# Patient Record
Sex: Male | Born: 1985 | Race: White | Hispanic: No | Marital: Married | Smoking: Never smoker
Health system: Southern US, Community
[De-identification: ages and names within clinical notes are randomized; demographics above are authoritative.]

## PROBLEM LIST (undated history)

## (undated) DIAGNOSIS — A0472 Enterocolitis due to Clostridium difficile, not specified as recurrent: Secondary | ICD-10-CM

## (undated) DIAGNOSIS — R1310 Dysphagia, unspecified: Secondary | ICD-10-CM

## (undated) DIAGNOSIS — E559 Vitamin D deficiency, unspecified: Secondary | ICD-10-CM

## (undated) DIAGNOSIS — R7989 Other specified abnormal findings of blood chemistry: Secondary | ICD-10-CM

## (undated) DIAGNOSIS — A498 Other bacterial infections of unspecified site: Secondary | ICD-10-CM

## (undated) HISTORY — DX: Enterocolitis due to Clostridium difficile, not specified as recurrent: A04.72

## (undated) HISTORY — DX: Dysphagia, unspecified: R13.10

## (undated) HISTORY — PX: OTHER SURGICAL HISTORY: SHX169

## (undated) HISTORY — DX: Other specified abnormal findings of blood chemistry: R79.89

---

## 2017-10-31 ENCOUNTER — Encounter (INDEPENDENT_AMBULATORY_CARE_PROVIDER_SITE_OTHER): Payer: Self-pay

## 2017-10-31 ENCOUNTER — Ambulatory Visit (INDEPENDENT_AMBULATORY_CARE_PROVIDER_SITE_OTHER): Payer: 59 | Admitting: Gastroenterology

## 2017-10-31 ENCOUNTER — Other Ambulatory Visit: Payer: Self-pay

## 2017-10-31 ENCOUNTER — Encounter: Payer: Self-pay | Admitting: Gastroenterology

## 2017-10-31 VITALS — BP 119/73 | HR 73 | Temp 98.6°F | Ht 72.0 in | Wt 194.0 lb

## 2017-10-31 DIAGNOSIS — R1084 Generalized abdominal pain: Secondary | ICD-10-CM

## 2017-10-31 DIAGNOSIS — R197 Diarrhea, unspecified: Secondary | ICD-10-CM

## 2017-10-31 DIAGNOSIS — R634 Abnormal weight loss: Secondary | ICD-10-CM

## 2017-10-31 MED ORDER — TRAMADOL HCL 50 MG PO TABS: 50.0000 mg | ORAL_TABLET | Freq: Four times a day (QID) | ORAL | 0 refills | Status: DC | PRN

## 2017-10-31 MED ORDER — DICYCLOMINE HCL 10 MG PO CAPS: 10.0000 mg | ORAL_CAPSULE | Freq: Three times a day (TID) | ORAL | 0 refills | Status: DC | PRN

## 2017-10-31 NOTE — Patient Instructions (Signed)
Today's care has been provided by Dr. Sherri Sear.  During this visit she has discussed the following for your plan of care:  1. Labs to be completed at [facility] 2. CT Enterography to be scheduled.  Nurse will contact you with appt. 3. Call office to schedule colonoscopy in about 2 weeks. 4. Follow up with Dr. Marius Ditch in 3 weeks.   Thank you for allowing to take part of caring for your healthcare needs.

## 2017-10-31 NOTE — Progress Notes (Signed)
Cephas Darby, MD [address]  [place]  [place], [place] [address]  Main: [phone]  Fax: [phone]    Gastroenterology Consultation  Referring Provider:     Suzan Garibaldi, FNP Primary Care Physician:  Suzan Garibaldi, FNP Primary Gastroenterologist:  Dr. Cephas Darby Reason for Consultation:     Chronic bloody diarrhea        HPI:   Carlos Silva is a 32 y.o. Caucasian male referred by Dr. Suzan Garibaldi, [facility]  for consultation & management of chronic bloody diarrhea. He has been experiencing diarrhea since October, 2018. His symptoms started initially with sore throat, abdominal pain, diarrhea, diagnosed as strep throat for which he received penicillin derived antibiotic. Subsequently he developed severe bloody diarrhea, 10-12 times per day associated with urgency, underwent stool studies including C. difficile which came back negative. He received 10 days course of metronidazole when he went to urgent care due to worsening of diarrhea. Patient reports that the initial stool studies were performed after he received metronidazole. His diarrhea subsided with antibiotics. Subsequently, he had cut on his finger at work for which he received tetanus and diphtheria vaccine. He reports that diarrhea returned after he received vaccine, he had blood in stool, lower abdominal discomfort, weight loss, rash on his body, predominantly hives associated with itching, as well as red spots on his palms and around joints, he shared pictures of his rash on his phone, severe arthralgias of small and large joints associated with weakness, limping. He took Diflucan for rash, thought it might be fungal. He lost about 30 pounds during this entire course. He limited po intake secondary to severe diarrhea. He reports having 4-5 bowel movements per day, predominantly nonbloody, sometimes sees streaks of blood. He had stool studies repeated twice including C. difficile due to ongoing diarrhea and came back  negative. Norovirus was negative. TSH was normal. He most recently had CBC, CMP which were unremarkable. H. pylori stool antigen came back negative as well. He has good appetite, but unable to eat due to diarrhea. He modified his diet, avoiding milk, added probiotics. He denies smoking or alcohol. He is taking Zyrtec whenever he develops hives  He is accompanied by his wife today who is concerned about parasitic infection given his family history. His mother who was initially thought to have ulcerative colitis and underwent colectomy, eventually found to have strongyloidiasis infection. His mother's sister had history of Entamoeba histolytica. Patient's wife is concerned that the parasites are not detected in stool and requesting for blood tests to evaluate for strongyloidiasis. 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  NSAIDs: None  Antiplts/Anticoagulants/Anti thrombotics: None  GI Procedures: None No prior GI surgeries No known family history of GI malignancy No recent travel Unknown HIV status  No past medical history on file.     Current Outpatient Medications:  .  diclofenac (VOLTAREN) 50 MG EC tablet, Take 50 mg by mouth 2 (two) times daily., Disp: , Rfl: 0 .  dicyclomine (BENTYL) 10 MG capsule, Take 1 capsule (10 mg total) by mouth 3 (three) times daily with meals as needed for up to 30 doses for spasms., Disp: 30 capsule, Rfl: 0 .  fluconazole (DIFLUCAN) 150 MG tablet, TAKE 1 TABLET BY MOUTH EVERY DAY FOR 5 DAYS, Disp: , Rfl: 0 .  traMADol (ULTRAM) 50 MG tablet, Take 1 tablet (50 mg total) by mouth every 6 (six) hours as needed  for up to 15 doses., Disp: 30 tablet, Rfl: 0   No family history on file.   Social History   Tobacco Use  . Smoking status: Never Smoker  . Smokeless tobacco: Never Used  Substance Use Topics  . Alcohol use: No    Frequency: Never  . Drug use: No    Allergies as of 10/31/2017  .  (No Known Allergies)    Review of Systems:    All systems reviewed and negative except where noted in HPI.   Physical Exam:  BP 119/73   Pulse 73   Temp 98.6 F (37 C) (Oral)   Ht 6' (1.829 m)   Wt 194 lb (88 kg)   BMI 26.31 kg/m  No LMP for male patient.  General:   Alert,  Well-developed, well-nourished, pleasant and cooperative in NAD Head:  Normocephalic and atraumatic. Eyes:  Sclera clear, no icterus.   Conjunctiva pink. Ears:  Normal auditory acuity. Nose:  No deformity, discharge, or lesions. Mouth:  No deformity or lesions,oropharynx pink & moist. Neck:  Supple; no masses or thyromegaly. Lungs:  Respirations even and unlabored.  Clear throughout to auscultation.   No wheezes, crackles, or rhonchi. No acute distress. Heart:  Regular rate and rhythm; no murmurs, clicks, rubs, or gallops. Abdomen:  Normal bowel sounds. Soft, non-tender and non-distended without masses, hepatosplenomegaly or hernias noted.  No guarding or rebound tenderness.   Rectal: Not performed Msk:  Symmetrical without gross deformities. Good, equal movement & strength bilaterally. Pulses:  Normal pulses noted. Extremities:  No clubbing or edema.  No cyanosis. Neurologic:  Alert and oriented x3;  grossly normal neurologically. Skin:  Intact without significant lesions or rashes. No jaundice. Lymph Nodes:  No significant cervical adenopathy. Psych:  Alert and cooperative. Normal mood and affect.  Imaging Studies: No abdominal imaging  Assessment and Plan:   [name] is a 32 y.o. Caucasian  male with chronic diarrhea and weight loss associated with rash, arthritis. His symptoms started after strep throat infection and was treated with antibiotics. His diarrhea temporarily responded to short course of metronidazole. Differentials include inflammatory bowel disease or systemic mastocytosis or chronic infections or vasculitis   Given that stool studies have been negative for infections x 3 so far, I  highly recommend a colonoscopy. However, patient's wife expressed concern about perforation and recommended noninvasive modalities like CT scan  - Check CRP, ESR, fecal calprotectin - Check HIV - Check celiac serologies  - Recheck stool studies  - Strongyloidiasis antibodies  - ANA, rheumatoid factor, immunoglobulin panel - CT enterography  - Colonoscopy after the above workup  - In the interim, suggested patient to try dicyclomine as needed  - Suggested him to stop probiotics, avoid NSAIDs - He requested pain medication for severe arthralgias that are restricting him from performing his physical activities. I prescribed him tramadol 50 mg, 30pills with no refills    Follow up in 2-3 weeks   Cephas Darby, MD

## 2017-11-03 ENCOUNTER — Telehealth: Payer: Self-pay

## 2017-11-03 NOTE — Telephone Encounter (Signed)
Patient has been informed that his CRP, which is an inflammatory marker is extremely elevated and is concerning for inflammation in his gastrointestinal tract. You're waiting on his stool studies as well. You have low threshold to perform colonoscopy if the CT enterography and/or stool studies does or does not reveal any information. I'm also waiting on strongyloidiasis antibodies.  Thanks [employer]

## 2017-11-03 NOTE — Telephone Encounter (Signed)
LVM for patient to inform him of his appt for CT Enterography at [facility] 11/07/17 arrival time 10:30am.  Nothing to eat or drink 4 hours prior to procedure.

## 2017-11-03 NOTE — Telephone Encounter (Signed)
Patient stated that his joint pain is getting worse. I informed him that I would ask whether we should refer to rheumatology? What would be the next step please advise.  Thanks [employer]

## 2017-11-04 LAB — CBC
HEMATOCRIT: 42.8 % (ref 37.5–51.0)
HEMOGLOBIN: 14.4 g/dL (ref 13.0–17.7)
MCH: 29.4 pg (ref 26.6–33.0)
MCHC: 33.6 g/dL (ref 31.5–35.7)
MCV: 87 fL (ref 79–97)
Platelets: 325 10*3/uL (ref 150–379)
RBC: 4.9 x10E6/uL (ref 4.14–5.80)
RDW: 13.1 % (ref 12.3–15.4)
WBC: 9.2 10*3/uL (ref 3.4–10.8)

## 2017-11-04 LAB — VITAMIN B12: VITAMIN B 12: 475 pg/mL (ref 232–1245)

## 2017-11-04 LAB — CELIAC AB TTG DGP TIGA
Antigliadin Abs, IgA: 3 units (ref 0–19)
Gliadin IgG: 3 units (ref 0–19)
IgA/Immunoglobulin A, Serum: 121 mg/dL (ref 90–386)

## 2017-11-04 LAB — IGG, IGA, IGM
IGG (IMMUNOGLOBIN G), SERUM: 828 mg/dL (ref 700–1600)
IGM (IMMUNOGLOBULIN M), SRM: 84 mg/dL (ref 20–172)

## 2017-11-04 LAB — COMPREHENSIVE METABOLIC PANEL
ALBUMIN: 4.4 g/dL (ref 3.5–5.5)
ALT: 10 IU/L (ref 0–44)
AST: 14 IU/L (ref 0–40)
Albumin/Globulin Ratio: 1.8 (ref 1.2–2.2)
Alkaline Phosphatase: 58 IU/L (ref 39–117)
BUN / CREAT RATIO: 14 (ref 9–20)
BUN: 11 mg/dL (ref 6–20)
Bilirubin Total: 0.3 mg/dL (ref 0.0–1.2)
CO2: 23 mmol/L (ref 20–29)
CREATININE: 0.79 mg/dL (ref 0.76–1.27)
Calcium: 9 mg/dL (ref 8.7–10.2)
Chloride: 101 mmol/L (ref 96–106)
GFR, EST AFRICAN AMERICAN: 138 mL/min/{1.73_m2} (ref >59)
GFR, EST NON AFRICAN AMERICAN: 120 mL/min/{1.73_m2} (ref >59)
GLOBULIN, TOTAL: 2.5 g/dL (ref 1.5–4.5)
Glucose: 169 mg/dL — ABNORMAL HIGH (ref 65–99)
Potassium: 4.1 mmol/L (ref 3.5–5.2)
Sodium: 142 mmol/L (ref 134–144)
Total Protein: 6.9 g/dL (ref 6.0–8.5)

## 2017-11-04 LAB — SEDIMENTATION RATE: Sed Rate: 11 mm/hr (ref 0–15)

## 2017-11-04 LAB — ANA: Anti Nuclear Antibody(ANA): NEGATIVE

## 2017-11-04 LAB — ANTI-DNA ANTIBODY, DOUBLE-STRANDED: dsDNA Ab: 1 IU/mL (ref 0–9)

## 2017-11-04 LAB — TSH: TSH: 0.58 u[IU]/mL (ref 0.450–4.500)

## 2017-11-04 LAB — RHEUMATOID FACTOR: Rhuematoid fact SerPl-aCnc: <10 IU/mL (ref 0.0–13.9)

## 2017-11-04 LAB — C-REACTIVE PROTEIN: CRP: 15.8 mg/L — ABNORMAL HIGH (ref 0.0–4.9)

## 2017-11-04 LAB — FERRITIN: FERRITIN: 153 ng/mL (ref 30–400)

## 2017-11-05 ENCOUNTER — Telehealth: Payer: Self-pay | Admitting: Gastroenterology

## 2017-11-05 ENCOUNTER — Other Ambulatory Visit: Payer: Self-pay

## 2017-11-05 DIAGNOSIS — A498 Other bacterial infections of unspecified site: Secondary | ICD-10-CM

## 2017-11-05 LAB — C DIFFICILE TOXINS A+B W/RFLX: C DIFFICILE TOXINS A+B, EIA: POSITIVE — AB

## 2017-11-05 MED ORDER — FIDAXOMICIN 200 MG PO TABS: 200.0000 mg | ORAL_TABLET | Freq: Two times a day (BID) | ORAL | 0 refills | Status: AC

## 2017-11-05 MED ORDER — VANCOMYCIN HCL 125 MG PO CAPS: 125.0000 mg | ORAL_CAPSULE | Freq: Four times a day (QID) | ORAL | 0 refills | Status: DC

## 2017-11-05 MED ORDER — FIDAXOMICIN 200 MG PO TABS: 200.0000 mg | ORAL_TABLET | Freq: Two times a day (BID) | ORAL | 0 refills | Status: DC

## 2017-11-05 NOTE — Telephone Encounter (Signed)
Pt would like a call from nurse regarding rx he would like rx for becktamision the other rx ranges from 400-500  Dollars, he also was told he could get a note for work and has not heard anything yet please call pt

## 2017-11-05 NOTE — Telephone Encounter (Signed)
[facility] calling to let us know pt was approved and they will notify pt

## 2017-11-07 ENCOUNTER — Ambulatory Visit: Payer: Managed Care, Other (non HMO)

## 2017-11-10 ENCOUNTER — Other Ambulatory Visit: Payer: Self-pay

## 2017-11-10 ENCOUNTER — Telehealth: Payer: Self-pay | Admitting: Gastroenterology

## 2017-11-10 NOTE — Telephone Encounter (Signed)
[name] called stating he didn't get his medication until Friday 11/07/17 due to mail order. 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 Please call patient.

## 2017-11-12 ENCOUNTER — Telehealth: Payer: Self-pay | Admitting: Gastroenterology

## 2017-11-12 NOTE — Telephone Encounter (Signed)
PT CALLED BACK HE JUST SPOKE WITH DR. VANGA AND HE WAS NOT SURE IF HE SHOULD TAKE THE RX DIFISIT OR NOT FOR 2 WEEKS? PLEASE CALL BACK

## 2017-11-13 ENCOUNTER — Other Ambulatory Visit: Payer: Self-pay

## 2017-11-13 ENCOUNTER — Telehealth: Payer: Self-pay | Admitting: Gastroenterology

## 2017-11-13 DIAGNOSIS — R197 Diarrhea, unspecified: Secondary | ICD-10-CM

## 2017-11-13 DIAGNOSIS — R109 Unspecified abdominal pain: Secondary | ICD-10-CM

## 2017-11-13 DIAGNOSIS — R634 Abnormal weight loss: Secondary | ICD-10-CM

## 2017-11-13 NOTE — Telephone Encounter (Signed)
Pt is calling to see if Dr. Marius Ditch put in the new Labs for him, please call pt once lab orders are in so he can go get them drawn

## 2017-11-13 NOTE — Progress Notes (Signed)
Hello Shanon Brow,  Dr. Marius Ditch has informed me that she would like for you to have the following labs checked:  Strongyloides and Fecal Calprotectin.  I've ordered these labs for [facility], however if they need to go to a different lab-please let me know so I can send the orders.  Thanks [place] CMA

## 2017-11-14 ENCOUNTER — Other Ambulatory Visit: Payer: Self-pay | Admitting: Gastroenterology

## 2017-11-14 ENCOUNTER — Other Ambulatory Visit: Payer: Self-pay

## 2017-11-14 DIAGNOSIS — R197 Diarrhea, unspecified: Secondary | ICD-10-CM

## 2017-11-14 DIAGNOSIS — R109 Unspecified abdominal pain: Secondary | ICD-10-CM

## 2017-11-14 DIAGNOSIS — R634 Abnormal weight loss: Secondary | ICD-10-CM

## 2017-11-14 NOTE — Telephone Encounter (Signed)
Please call patient regarding labs

## 2017-11-17 ENCOUNTER — Encounter: Payer: Self-pay | Admitting: Gastroenterology

## 2017-11-17 ENCOUNTER — Ambulatory Visit (INDEPENDENT_AMBULATORY_CARE_PROVIDER_SITE_OTHER): Payer: Managed Care, Other (non HMO) | Admitting: Adult Health

## 2017-11-17 ENCOUNTER — Encounter: Payer: Self-pay | Admitting: Adult Health

## 2017-11-17 VITALS — BP 109/68 | HR 59 | Ht 72.01 in | Wt 193.0 lb

## 2017-11-17 DIAGNOSIS — R7989 Other specified abnormal findings of blood chemistry: Secondary | ICD-10-CM | POA: Diagnosis not present

## 2017-11-17 DIAGNOSIS — Z0000 Encounter for general adult medical examination without abnormal findings: Secondary | ICD-10-CM | POA: Diagnosis not present

## 2017-11-17 HISTORY — DX: Other specified abnormal findings of blood chemistry: R79.89

## 2017-11-17 LAB — STRONGYLOIDES, AB, IGG: STRONGYLOIDES, AB, IGG: NEGATIVE

## 2017-11-17 NOTE — Progress Notes (Signed)
Subjective:    Patient ID: Carlos Silva, male    DOB: [birth year], 32 y.o.   MRN: [id number]  HPI:  [name] presents to establish as a new pt.  He is a pleasant 32 year old male.  06/2017: He had sore throat, abdominal pain, diarrhea, diagnosed as strep throat for which he received penicillin derived ABX. Subsequently he developed severe bloody diarrhea, 10-12 times per day associated with urgency, underwent stool studies including C. difficile which came back negative. He received 10 days course of metronidazole when he went to urgent care due to worsening of diarrhea and abdominal pain. 10/06/17: R 2nd finger laceration sustained at work for which he received tdap. He reports that diarrhea returned after he received vaccine, he had blood in stool, lower abdominal pain,  rash/hives on trunk associated with itching, as well as red spots on his palms and diffuse joint pain-predominantly in L hip. He lost >35 lbs since Oct 2018, largely due to limiting PO intake due to severe diarrhea.  10/31/17- C-reactive protein sig elevated- 15.8 (normal 0-4.9). ANA/Rheumatoid Factors- Neg 10/31/17- CMP/CBC - WNL 10/31/17- C diff : Pos  He modified his diet, avoiding dairy, added multivitamin and reports "feeling like myself the last 3 days". He denies smoking or alcohol. He is taking Zyrtec whenever he develops hives Family GI hx- his mother initially thought to have ulcerative colitis and underwent colectomy, eventually found to have strongyloidiasis infection.  Due to colectomy his mother is very wary of modern medical care and  His maternal Aunt had history of Entamoeba histolytica. They live in a farm with poultry. 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 He reports mild/acute depression r/t recent illness.  He declined to complete PHQ today b/c "it isn't an accurate look at how I really feel". His wife is a BS during [employer]   Patient Care Team    Relationship Specialty Notifications Start End  Erikka Follmer, Berna Spare, NP PCP - General Family Medicine  11/17/17   Lin Landsman, MD Consulting Physician Gastroenterology  11/17/17     Patient Active Problem List   Diagnosis Date Noted  . Healthcare maintenance 11/17/2017   No past medical history on file.  Family History  Problem Relation Age of Onset  . Congestive Heart Failure Mother   . Vision loss Maternal Grandfather   . Cancer Paternal Grandfather      Social History   Substance and Sexual Activity  Drug Use No     Social History   Substance and Sexual Activity  Alcohol Use No  . Frequency: Never     Social History   Tobacco Use  Smoking Status Never Smoker  Smokeless Tobacco Never Used     Outpatient Encounter Medications as of 11/17/2017  Medication Sig  . cetirizine (ZYRTEC) 10 MG tablet Take 10 mg by mouth daily.  . fidaxomicin (DIFICID) 200 MG TABS tablet Take by mouth 2 (two) times daily.  . Multiple Vitamin (MULTIVITAMIN) tablet Take by mouth daily.  . traMADol (ULTRAM) 50 MG tablet Take 1 tablet (50 mg total) by mouth every 6 (six) hours as needed for up to 15 doses.  . [DISCONTINUED] diclofenac (VOLTAREN) 50 MG EC tablet Take 50 mg by mouth 2 (two) times daily.  . [DISCONTINUED] dicyclomine (BENTYL) 10 MG capsule Take 1 capsule (10 mg total) by mouth 3 (three) times daily with meals as needed for up to 30 doses for spasms.  . [DISCONTINUED] fluconazole (DIFLUCAN) 150 MG tablet TAKE  1 TABLET BY MOUTH EVERY DAY FOR 5 DAYS   No facility-administered encounter medications on file as of 11/17/2017.     Allergies: Patient has no known allergies.  Body mass index is 26.17 kg/m.  Blood pressure 109/68, pulse (!) 59, height 6' 0.01" (1.829 m), weight 193 lb (87.5 kg), SpO2 98 %.   Review of Systems  Constitutional: Positive for activity change, appetite change, fatigue and unexpected weight change. Negative for chills, diaphoresis and fever.  Eyes: Negative for visual disturbance.  Respiratory: Negative for  cough, chest tightness, shortness of breath, wheezing and stridor.   Cardiovascular: Negative for chest pain, palpitations and leg swelling.  Gastrointestinal: Negative for abdominal distention, abdominal pain, blood in stool, constipation, diarrhea, nausea and vomiting.  Endocrine: Negative for cold intolerance.  Genitourinary: Negative for difficulty urinating and flank pain.  Musculoskeletal: Positive for arthralgias and myalgias.  Neurological: Negative for dizziness and headaches.  Hematological: Does not bruise/bleed easily.  Psychiatric/Behavioral: Positive for dysphoric mood. The patient is nervous/anxious.        Objective:   Physical Exam  Constitutional: He is oriented to person, place, and time. He appears well-developed and well-nourished. No distress.  HENT:  Head: Normocephalic and atraumatic.  Right Ear: External ear normal.  Left Ear: External ear normal.  Cardiovascular: Normal rate, regular rhythm, normal heart sounds and intact distal pulses.  No murmur heard. Pulmonary/Chest: Effort normal and breath sounds normal.  Abdominal: Soft. Bowel sounds are normal. He exhibits no distension and no mass. There is no tenderness. There is no rebound and no guarding.  + BS in all quadrants  Neurological: He is alert and oriented to person, place, and time.  Skin: Skin is warm and dry. No rash noted. He is not diaphoretic. No erythema. No pallor.  Psychiatric: He has a normal mood and affect. His behavior is normal. Judgment and thought content normal.  Nursing note and vitals reviewed.      Assessment & Plan:   1. Healthcare maintenance   2. Abnormal C-reactive protein     Healthcare maintenance  Complete course of Fidaxomicin (Dificid) as directed by GI. Continue with GI as directed. Once you confirm which Rheumatologist is in your network, please call clinic so we can place referral. Continue healthy eating and daily multivitamin. Recommend complete physical with  fasting labs (as needed) in 3 months.   Abnormal C-reactive protein  10/31/17- C-reactive protein sig elevated- 15.8 (normal 0-4.9). ANA/Rheumatoid Factors- Neg With diffuse joint pain/rash over body, recommend referral to Rheumatologist        Pt was in the office today for 40+ minutes, with over 50% time spent in face to face           counseling of various medical concerns and in coordination of care  FOLLOW-UP:  Return in about 3 months (around 02/17/2018) for Regular Follow Up, CPE.

## 2017-11-17 NOTE — Assessment & Plan Note (Signed)
10/31/17- C-reactive protein sig elevated- 15.8 (normal 0-4.9). ANA/Rheumatoid Factors- Neg With diffuse joint pain/rash over body, recommend referral to Rheumatologist

## 2017-11-17 NOTE — Patient Instructions (Signed)
Heart-Healthy Eating Plan Heart-healthy meal planning includes:  Limiting unhealthy fats.  Increasing healthy fats.  Making other small dietary changes.  You may need to talk with your doctor or a diet specialist (dietitian) to create an eating plan that is right for you. What types of fat should I choose?  Choose healthy fats. These include olive oil and canola oil, flaxseeds, walnuts, almonds, and seeds.  Eat more omega-3 fats. These include salmon, mackerel, sardines, tuna, flaxseed oil, and ground flaxseeds. Try to eat fish at least twice each week.  Limit saturated fats. ? Saturated fats are often found in animal products, such as meats, butter, and cream. ? Plant sources of saturated fats include palm oil, palm kernel oil, and coconut oil.  Avoid foods with partially hydrogenated oils in them. These include stick margarine, some tub margarines, cookies, crackers, and other baked goods. These contain trans fats. What general guidelines do I need to follow?  Check food labels carefully. Identify foods with trans fats or high amounts of saturated fat.  Fill one half of your plate with vegetables and green salads. Eat 4-5 servings of vegetables per day. A serving of vegetables is: ? 1 cup of raw leafy vegetables. ?  cup of raw or cooked cut-up vegetables. ?  cup of vegetable juice.  Fill one fourth of your plate with whole grains. Look for the word "whole" as the first word in the ingredient list.  Fill one fourth of your plate with lean protein foods.  Eat 4-5 servings of fruit per day. A serving of fruit is: ? One medium whole fruit. ?  cup of dried fruit. ?  cup of fresh, frozen, or canned fruit. ?  cup of 100% fruit juice.  Eat more foods that contain soluble fiber. These include apples, broccoli, carrots, beans, peas, and barley. Try to get 20-30 g of fiber per day.  Eat more home-cooked food. Eat less restaurant, buffet, and fast food.  Limit or avoid  alcohol.  Limit foods high in starch and sugar.  Avoid fried foods.  Avoid frying your food. Try baking, boiling, grilling, or broiling it instead. You can also reduce fat by: ? Removing the skin from poultry. ? Removing all visible fats from meats. ? Skimming the fat off of stews, soups, and gravies before serving them. ? Steaming vegetables in water or broth.  Lose weight if you are overweight.  Eat 4-5 servings of nuts, legumes, and seeds per week: ? One serving of dried beans or legumes equals  cup after being cooked. ? One serving of nuts equals 1 ounces. ? One serving of seeds equals  ounce or one tablespoon.  You may need to keep track of how much salt or sodium you eat. This is especially true if you have high blood pressure. Talk with your doctor or dietitian to get more information. What foods can I eat? Grains Breads, including French, white, pita, wheat, raisin, rye, oatmeal, and Italian. Tortillas that are neither fried nor made with lard or trans fat. Low-fat rolls, including hotdog and hamburger buns and English muffins. Biscuits. Muffins. Waffles. Pancakes. Light popcorn. Whole-grain cereals. Flatbread. Melba toast. Pretzels. Breadsticks. Rusks. Low-fat snacks. Low-fat crackers, including oyster, saltine, matzo, graham, animal, and rye. Rice and pasta, including brown rice and pastas that are made with whole wheat. Vegetables All vegetables. Fruits All fruits, but limit coconut. Meats and Other Protein Sources Lean, well-trimmed beef, veal, pork, and lamb. Chicken and turkey without skin. All fish and shellfish.   Wild duck, rabbit, pheasant, and venison. Egg whites or low-cholesterol egg substitutes. Dried beans, peas, lentils, and tofu. Seeds and most nuts. Dairy Low-fat or nonfat cheeses, including ricotta, string, and mozzarella. Skim or 1% milk that is liquid, powdered, or evaporated. Buttermilk that is made with low-fat milk. Nonfat or low-fat  yogurt. Beverages Mineral water. Diet carbonated beverages. Sweets and Desserts Sherbets and fruit ices. Honey, jam, marmalade, jelly, and syrups. Meringues and gelatins. Pure sugar candy, such as hard candy, jelly beans, gumdrops, mints, marshmallows, and small amounts of dark chocolate. [employer] Eat all sweets and desserts in moderation. Fats and Oils Nonhydrogenated (trans-free) margarines. Vegetable oils, including soybean, sesame, sunflower, olive, peanut, safflower, corn, canola, and cottonseed. Salad dressings or mayonnaise made with a vegetable oil. Limit added fats and oils that you use for cooking, baking, salads, and as spreads. Other Cocoa powder. Coffee and tea. All seasonings and condiments. The items listed above may not be a complete list of recommended foods or beverages. Contact your dietitian for more options. What foods are not recommended? Grains Breads that are made with saturated or trans fats, oils, or whole milk. Croissants. Butter rolls. Cheese breads. Sweet rolls. Donuts. Buttered popcorn. Chow mein noodles. High-fat crackers, such as cheese or butter crackers. Meats and Other Protein Sources Fatty meats, such as hotdogs, short ribs, sausage, spareribs, bacon, rib eye roast or steak, and mutton. High-fat deli meats, such as salami and bologna. Caviar. Domestic duck and goose. Organ meats, such as kidney, liver, sweetbreads, and heart. Dairy Cream, sour cream, cream cheese, and creamed cottage cheese. Whole-milk cheeses, including blue (bleu), [place], [place], [place], American, [place], Swiss, cheddar, [place], and [place]. Whole or 2% milk that is liquid, evaporated, or condensed. Whole buttermilk. Cream sauce or high-fat cheese sauce. Yogurt that is made from whole milk. Beverages Regular sodas and juice drinks with added sugar. Sweets and Desserts Frosting. Pudding. Cookies. Cakes other than angel food cake. Candy that has milk chocolate or white  chocolate, hydrogenated fat, butter, coconut, or unknown ingredients. Buttered syrups. Full-fat ice cream or ice cream drinks. Fats and Oils Gravy that has suet, meat fat, or shortening. Cocoa butter, hydrogenated oils, palm oil, coconut oil, palm kernel oil. These can often be found in baked products, candy, fried foods, nondairy creamers, and whipped toppings. Solid fats and shortenings, including bacon fat, salt pork, lard, and butter. Nondairy cream substitutes, such as coffee creamers and sour cream substitutes. Salad dressings that are made of unknown oils, cheese, or sour cream. The items listed above may not be a complete list of foods and beverages to avoid. Contact your dietitian for more information. This information is not intended to replace advice given to you by your health care provider. Make sure you discuss any questions you have with your health care provider. Document Released: 02/18/2012 Document Revised: 01/25/2016 Document Reviewed: 02/10/2014 Elsevier Interactive Patient Education  2018 [employer].   Complete course of Fidaxomicin (Dificid) as directed by GI. Continue with GI as directed. Once you confirm which Rheumatologist is in your network, please call clinic so we can place referral. Continue healthy eating and daily multivitamin. Recommend complete physical with fasting labs (as needed) in 3 months.  WELCOME TO THE PRACTICE!

## 2017-11-17 NOTE — Assessment & Plan Note (Signed)
  Complete course of Fidaxomicin (Dificid) as directed by GI. Continue with GI as directed. Once you confirm which Rheumatologist is in your network, please call clinic so we can place referral. Continue healthy eating and daily multivitamin. Recommend complete physical with fasting labs (as needed) in 3 months.

## 2017-11-18 ENCOUNTER — Telehealth: Payer: Self-pay | Admitting: Adult Health

## 2017-11-18 DIAGNOSIS — M25551 Pain in right hip: Secondary | ICD-10-CM

## 2017-11-18 DIAGNOSIS — R7989 Other specified abnormal findings of blood chemistry: Secondary | ICD-10-CM

## 2017-11-18 DIAGNOSIS — M25552 Pain in left hip: Secondary | ICD-10-CM

## 2017-11-18 NOTE — Telephone Encounter (Signed)
Pt was told to chk with [employer] regarding an Network provider for Rheumatologist:  Dr Lahoma Rocker with [place] per patient's wife is who was advised.  ----- pls send referral for patient.  --glh

## 2017-11-18 NOTE — Telephone Encounter (Signed)
Referral placed per Mina Marble, NP.  Charyl Bigger,  CMA

## 2017-11-19 ENCOUNTER — Encounter: Payer: Self-pay | Admitting: Gastroenterology

## 2017-11-20 LAB — CALPROTECTIN, FECAL

## 2017-12-01 ENCOUNTER — Encounter: Payer: Self-pay | Admitting: Gastroenterology

## 2017-12-01 ENCOUNTER — Ambulatory Visit (INDEPENDENT_AMBULATORY_CARE_PROVIDER_SITE_OTHER): Payer: Managed Care, Other (non HMO) | Admitting: Gastroenterology

## 2017-12-01 VITALS — BP 128/78 | HR 72 | Ht 72.0 in | Wt 195.2 lb

## 2017-12-01 DIAGNOSIS — Z8619 Personal history of other infectious and parasitic diseases: Secondary | ICD-10-CM | POA: Diagnosis not present

## 2017-12-01 DIAGNOSIS — R197 Diarrhea, unspecified: Secondary | ICD-10-CM

## 2017-12-01 NOTE — Patient Instructions (Signed)
1. Florastar, culturelle, VSL #3, Align are different probiotics   Please call our office to speak with my nurse Orvilla Fus at [phone] during business hours from 8am to 4pm if you have any questions/concerns. During after hours, you will be redirected to on call GI physician. For any emergency please call 911 or go the nearest emergency room.    Cephas Darby, MD [address]  [place]  [place], [place] [address]  Main: [phone]  Fax: [phone]

## 2017-12-01 NOTE — Progress Notes (Signed)
Cephas Darby, MD [address]  [place]  [place], [place] [address]  Main: [phone]  Fax: [phone]    Gastroenterology Consultation  Referring Provider:     Suzan Garibaldi, FNP Primary Care Physician:  Esaw Grandchild, NP Primary Gastroenterologist:  Dr. Cephas Darby Reason for Consultation:     Chronic bloody diarrhea        HPI:   Carlos Silva is a 32 y.o. Caucasian male referred by Dr. Loleta Books, Berna Spare, NP  for consultation & management of chronic bloody diarrhea. He has been experiencing diarrhea since October, 2018. His symptoms started initially with sore throat, abdominal pain, diarrhea, diagnosed as strep throat for which he received penicillin derived antibiotic. Subsequently he developed severe bloody diarrhea, 10-12 times per day associated with urgency, underwent stool studies including C. difficile which came back negative. He received 10 days course of metronidazole when he went to urgent care due to worsening of diarrhea. Patient reports that the initial stool studies were performed after he received metronidazole. His diarrhea subsided with antibiotics. Subsequently, he had cut on his finger at work for which he received tetanus and diphtheria vaccine. He reports that diarrhea returned after he received vaccine, he had blood in stool, lower abdominal discomfort, weight loss, rash on his body, predominantly hives associated with itching, as well as red spots on his palms and around joints, he shared pictures of his rash on his phone, severe arthralgias of small and large joints associated with weakness, limping. He took Diflucan for rash, thought it might be fungal. He lost about 30 pounds during this entire course. He limited po intake secondary to severe diarrhea. He reports having 4-5 bowel movements per day, predominantly nonbloody, sometimes sees streaks of blood. He had stool studies repeated twice including C. difficile due to ongoing diarrhea and came back  negative. Norovirus was negative. TSH was normal. He most recently had CBC, CMP which were unremarkable. H. pylori stool antigen came back negative as well. He has good appetite, but unable to eat due to diarrhea. He modified his diet, avoiding milk, added probiotics. He denies smoking or alcohol. He is taking Zyrtec whenever he develops hives  He is accompanied by his wife today who is concerned about parasitic infection given his family history. His mother who was initially thought to have ulcerative colitis and underwent colectomy, eventually found to have strongyloidiasis infection. His mother's sister had history of Entamoeba histolytica. Patient's wife is concerned that the parasites are not detected in stool and requesting for blood tests to evaluate for strongyloidiasis. 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  Follow-up visit 12/01/2017 Since last visit, patient had stool studies, resulted positive for C. Difficile toxin, completed 10 days course of dificid. His diarrhea has significantly resolved and overall his weight has been stable. His appetite has improved and able to tolerate some foods. However, he has several questions today along with his wife and concerned about ongoing rash and intolerance to several foods such as processed foods, lactose products. He is still concerned about developing rash after eating certain foods and has to take antihistamine medications. He no longer has blood in stools, his bowels are mostly soft in consistency, up to 3-4 times daily. 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 He does have mild abdominal discomfort especially before  having a bowel movement. He had repeat CRP at rheumatologist's office and came back normal. Strongyloides antibodies came back negative, fecal calprotectin came  back normal. Both patient and his wife are concerned about the residual symptoms and wanted to know if he is recovering from C. Difficile or if he has alternative pathology going on. He is resistant to undergo colonoscopy at this point and concerned about taking biopsies leading to complications. His wife wanted to know if he could be evaluated for systemic mastocytosis.  NSAIDs: None  Antiplts/Anticoagulants/Anti thrombotics: None  GI Procedures: None No prior GI surgeries No known family history of GI malignancy No recent travel Unknown HIV status  History reviewed. No pertinent past medical history.  Current Outpatient Medications:  .  cetirizine (ZYRTEC) 10 MG tablet, Take 10 mg by mouth daily., Disp: , Rfl:  .  fidaxomicin (DIFICID) 200 MG TABS tablet, Take by mouth 2 (two) times daily., Disp: , Rfl:  .  Multiple Vitamin (MULTIVITAMIN) tablet, Take by mouth daily., Disp: , Rfl:  .  traMADol (ULTRAM) 50 MG tablet, Take 1 tablet (50 mg total) by mouth every 6 (six) hours as needed for up to 15 doses., Disp: 30 tablet, Rfl: 0   Family History  Problem Relation Age of Onset  . Congestive Heart Failure Mother   . Vision loss Maternal Grandfather   . Cancer Paternal Grandfather      Social History   Tobacco Use  . Smoking status: Never Smoker  . Smokeless tobacco: Never Used  Substance Use Topics  . Alcohol use: No    Frequency: Never  . Drug use: No    Allergies as of 12/01/2017  . (No Known Allergies)    Review of Systems:    All systems reviewed and negative except where noted in HPI.   Physical Exam:  BP 128/78   Pulse 72   Ht 6' (1.829 m)   Wt 195 lb 3.2 oz (88.5 kg)   BMI 26.47 kg/m  No LMP for male patient.  General:   Alert,  Well-developed, well-nourished, pleasant and cooperative in NAD Head:  Normocephalic and atraumatic. Eyes:  Sclera clear, no icterus.   Conjunctiva pink. Ears:  Normal auditory acuity. Nose:  No deformity, discharge, or  lesions. Mouth:  No deformity or lesions,oropharynx pink & moist. Neck:  Supple; no masses or thyromegaly. Lungs:  Respirations even and unlabored.  Clear throughout to auscultation.   No wheezes, crackles, or rhonchi. No acute distress. Heart:  Regular rate and rhythm; no murmurs, clicks, rubs, or gallops. Abdomen:  Normal bowel sounds. Soft, non-tender and non-distended without masses, hepatosplenomegaly or hernias noted.  No guarding or rebound tenderness.   Rectal: Not performed Msk:  Symmetrical without gross deformities. Good, equal movement & strength bilaterally. Pulses:  Normal pulses noted. Extremities:  No clubbing or edema.  No cyanosis. Neurologic:  Alert and oriented x3;  grossly normal neurologically. Skin:  Intact without significant lesions or rashes. No jaundice. Lymph Nodes:  No significant cervical adenopathy. Psych:  Alert and cooperative. Normal mood and affect.  Imaging Studies: No abdominal imaging  Assessment and Plan:   Carlos Silva is a 32 y.o. Caucasian  male with chronic diarrhea and weight loss associated with rash, arthritis. His symptoms started after strep throat infection and was treated with antibiotics. His diarrhea temporarily responded to short course of metronidazole. Differentials include inflammatory bowel disease or systemic mastocytosis or chronic infections or vasculitis. Workup revealed C. Difficile infection and underwent treatment with dificid  x 10days. His diarrhea has currently resolved and weight has been stable. He continues to have arthralgias and rash, intolerance to several foods. His CRP has normalized it is reassuring. His residual GI symptoms are probably secondary to recent C. Difficile infection. Encouraged him to try probiotics, avoid lactose, processed foods. He is interested in undergoing further testing to look for alternative etiologies. However, he does not want to undergo colonoscopy at this point. He also appears to be worried  about flushing and rash, intolerance to several foods I recommended him to see dermatology or an immunologist at a tertiary care center  - check serum trypase levels - check C1 esterase inhibitor levels  I spent a total of 60 minutes of which more than 50% was spent in counseling and coordination of care regarding ongoing GI symptoms, recovery from C. Difficile infection and possibility of recurrence of C. Difficile if his symptoms flare up, discussing about alternative etiologies and further workup. I also discussed with him about the implications of colonoscopy, risks and benefits associated with it if his symptoms worsen. Patient and his wife expressed understanding of the plan and he will contact via my chart.   Follow up in clinic as needed if symptoms worsen   Cephas Darby, MD

## 2017-12-03 LAB — TRYPTASE: TRYPTASE: 12.9 ug/L (ref 2.2–13.2)

## 2017-12-03 LAB — C1 ESTERASE INHIBITOR: C1INH SerPl-mCnc: 28 mg/dL (ref 21–39)

## 2018-01-29 ENCOUNTER — Ambulatory Visit: Payer: Managed Care, Other (non HMO) | Admitting: Adult Health

## 2018-02-02 ENCOUNTER — Ambulatory Visit (INDEPENDENT_AMBULATORY_CARE_PROVIDER_SITE_OTHER): Payer: Managed Care, Other (non HMO) | Admitting: Adult Health

## 2018-02-02 ENCOUNTER — Encounter: Payer: Self-pay | Admitting: Adult Health

## 2018-02-02 VITALS — BP 110/70 | HR 68 | Ht 72.0 in | Wt 206.8 lb

## 2018-02-02 DIAGNOSIS — R1310 Dysphagia, unspecified: Secondary | ICD-10-CM

## 2018-02-02 DIAGNOSIS — L659 Nonscarring hair loss, unspecified: Secondary | ICD-10-CM | POA: Insufficient documentation

## 2018-02-02 DIAGNOSIS — A0472 Enterocolitis due to Clostridium difficile, not specified as recurrent: Secondary | ICD-10-CM | POA: Diagnosis not present

## 2018-02-02 DIAGNOSIS — F419 Anxiety disorder, unspecified: Secondary | ICD-10-CM | POA: Insufficient documentation

## 2018-02-02 DIAGNOSIS — Z0000 Encounter for general adult medical examination without abnormal findings: Secondary | ICD-10-CM

## 2018-02-02 HISTORY — DX: Dysphagia, unspecified: R13.10

## 2018-02-02 LAB — POCT RAPID STREP A (OFFICE): Rapid Strep A Screen: NEGATIVE

## 2018-02-02 NOTE — Patient Instructions (Signed)
Clostridium Difficile Infection Clostridium difficile (C. difficile or C. diff) infection causes inflammation of the large intestine (colon). This condition can result in damage to the lining of your colon and may lead to another condition called colitis. This infection can be passed from person to person (is contagious). Follow these instructions at home: Eating and drinking  Drink enough fluid to keep your pee (urine) clear or pale yellow.  Avoid drinking: ? Milk. ? Caffeine. ? Alcohol.  Follow exact instructions from your doctor about how to get enough fluid in your body (rehydrate).  Eat small meals often instead of large meals. Medicines  Take your antibiotic medicine as told by your doctor. Do not stop taking the antibiotic even if you start to feel better unless your doctor told you to do that.  Take over-the-counter and prescription medicines only as told by your doctor.  Do not use medicines to help with watery poop (diarrhea). General instructions  Wash your hands fully before you prepare food and after you use the bathroom. Make sure people who live with you also wash their  hands often.  Clean the surfaces that you touch. Use a product that contains chlorine bleach.  Keep all follow-up visits as told by your doctor. This is important. Contact a doctor if:  Your symptoms do not get better with treatment.  Your symptoms get worse with treatment.  Your symptoms go away and then come back.  You have a fever.  You have new symptoms. Get help right away if:  You have more pain or tenderness in your belly (abdomen).  Your poop (stool) is mostly bloody.  Your poop looks dark black and tarry.  You cannot eat or drink without throwing up (vomiting).  You have signs of dehydration, such as: ? Dark pee, very little pee, or no pee. ? Cracked lips. ? Not making tears when you cry. ? Dry mouth. ? Sunken eyes. ? Feeling sleepy. ? Feeling weak. ? Feeling  dizzy. This information is not intended to replace advice given to you by your health care provider. Make sure you discuss any questions you have with your health care provider. Document Released: 06/16/2009 Document Revised: 01/25/2016 Document Reviewed: 02/20/2015 Elsevier Interactive Patient Education  2017 [place].   Please have fasting labs drawn tomorrow. Ultrasound of neck ordered. We will call you when results are available. Please call clinic with questions/concerns. FEEL BETTER!

## 2018-02-02 NOTE — Assessment & Plan Note (Signed)
Strep Test- Neg US soft tissue/neck ordered Thyroid panel ordered

## 2018-02-02 NOTE — Assessment & Plan Note (Signed)
Pt's face is flushed and he appears quite anxious/nervous

## 2018-02-02 NOTE — Assessment & Plan Note (Signed)
Thyroid panel ordered Last TSH 10/31/17- 0.580

## 2018-02-02 NOTE — Progress Notes (Signed)
Subjective:    Patient ID: Carlos Silva, male    DOB: 03-Feb-1986, 32 y.o.   MRN: [id number]  HPI: 11/17/17 OV:  Carlos Silva presents to establish as a new pt.  He is a pleasant 32 year old male.  06/2017: He had sore throat, abdominal pain, diarrhea, diagnosed as strep throat for which he received penicillin derived ABX. Subsequently he developed severe bloody diarrhea, 10-12 times per day associated with urgency, underwent stool studies including C. difficile which came back negative. He received 10 days course of metronidazole when he went to urgent care due to worsening of diarrhea and abdominal pain. 10/06/17: R 2nd finger laceration sustained at work for which he received tdap. He reports that diarrhea returned after he received vaccine, he had blood in stool, lower abdominal pain,  rash/hives on trunk associated with itching, as well as red spots on his palms and diffuse joint pain-predominantly in L hip. He lost >35 lbs since Oct 2018, largely due to limiting PO intake due to severe diarrhea.  10/31/17- C-reactive protein sig elevated- 15.8 (normal 0-4.9). ANA/Rheumatoid Factors- Neg 10/31/17- CMP/CBC - WNL 10/31/17- C diff : Pos  He modified his diet, avoiding dairy, added multivitamin and reports "feeling like myself the last 3 days". He denies smoking or alcohol. He is taking Zyrtec whenever he develops hives Family GI hx- his mother initially thought to have ulcerative colitis and underwent colectomy, eventually found to have strongyloidiasis infection.  Due to colectomy his mother is very wary of modern medical care and  His maternal Aunt had history of Entamoeba histolytica. They live in a farm with poultry. 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 He reports mild/acute depression r/t recent illness.  He declined to complete PHQ today b/c "it isn't an accurate look at how I really feel". His wife is a BS during [employer]   02/02/18 OV: Carlos Silva presents with pain with swallowing or yawning,  described as "dull ache" and rates 2/10, that began >2 weeks ago.  He reports discomfort when pressing on his laryngeal prominence.  He denies throat pain at rest. He denies hx of GERD. He denies fever/night sweats/chills. He reports intermittent nausea without vomiting. He reports hx of seasonal allergies, however denies current nasal drainage, cough, post nasal gtt He also reports recent hair loss Wife at [employer] during [place]   Patient Care Team    Relationship Specialty Notifications Start End  Esaw Grandchild, NP PCP - General Family Medicine  11/17/17   Lin Landsman, MD Consulting Physician Gastroenterology  11/17/17     Patient Active Problem List   Diagnosis Date Noted  . Odynophagia 02/02/2018  . Hair loss 02/02/2018  . Anxiety 02/02/2018  . C. difficile enteritis 02/02/2018  . Healthcare maintenance 11/17/2017  . Abnormal C-reactive protein 11/17/2017   Past Medical History:  Diagnosis Date  . C. difficile enteritis     Family History  Problem Relation Age of Onset  . Congestive Heart Failure Mother   . Vision loss Maternal Grandfather   . Cancer Paternal Grandfather      Social History   Substance and Sexual Activity  Drug Use No     Social History   Substance and Sexual Activity  Alcohol Use No  . Frequency: Never     Social History   Tobacco Use  Smoking Status Never Smoker  Smokeless Tobacco Never Used     Outpatient Encounter Medications as of 02/02/2018  Medication Sig  . cetirizine (ZYRTEC)  10 MG tablet Take 10 mg by mouth daily.  . fidaxomicin (DIFICID) 200 MG TABS tablet Take by mouth 2 (two) times daily.  . Multiple Vitamin (MULTIVITAMIN) tablet Take by mouth daily.  . traMADol (ULTRAM) 50 MG tablet Take 1 tablet (50 mg total) by mouth every 6 (six) hours as needed for up to 15 doses.   No facility-administered encounter medications on file as of 02/02/2018.     Allergies: Patient has no known allergies.  Body mass index is 28.05  kg/m.  Blood pressure 110/70, pulse 68, height 6' (1.829 m), weight 206 lb 12.8 oz (93.8 kg), SpO2 98 %.   Review of Systems  Constitutional: Positive for fatigue. Negative for activity change, appetite change, chills, diaphoresis, fever and unexpected weight change.  HENT: Positive for sore throat. Negative for congestion, postnasal drip, sinus pressure, trouble swallowing and voice change.   Eyes: Negative for visual disturbance.  Respiratory: Negative for cough, chest tightness, shortness of breath, wheezing and stridor.   Cardiovascular: Negative for chest pain, palpitations and leg swelling.  Gastrointestinal: Negative for abdominal distention, abdominal pain, blood in stool, constipation, diarrhea, nausea and vomiting.  Endocrine: Negative for cold intolerance.  Genitourinary: Negative for difficulty urinating and flank pain.  Musculoskeletal: Positive for myalgias. Negative for arthralgias.  Neurological: Negative for dizziness and headaches.  Hematological: Does not bruise/bleed easily.  Psychiatric/Behavioral: Positive for dysphoric mood. The patient is nervous/anxious.        Objective:   Physical Exam  Constitutional: He is oriented to person, place, and time. He appears well-developed and well-nourished. No distress.  HENT:  Head: Normocephalic and atraumatic.  Right Ear: External ear normal.  Left Ear: External ear normal.  Neck: Normal range of motion. Neck supple. No tracheal tenderness present. No neck rigidity. No tracheal deviation present. No thyroid mass and no thyromegaly present.  Cardiovascular: Normal rate, regular rhythm, normal heart sounds and intact distal pulses.  No murmur heard. Pulmonary/Chest: Effort normal and breath sounds normal.  Abdominal: Soft. Bowel sounds are normal.  Lymphadenopathy:    He has no cervical adenopathy.  Neurological: He is alert and oriented to person, place, and time.  Skin: Skin is warm and dry. No rash noted. He is not  diaphoretic. No erythema. No pallor.  Psychiatric: He has a normal mood and affect. His behavior is normal. Judgment and thought content normal.  Nursing note and vitals reviewed.      Assessment & Plan:   1. Healthcare maintenance   2. Odynophagia   3. Hair loss   4. Anxiety   5. C. difficile enteritis     Odynophagia Strep Test- Neg US soft tissue/neck ordered Thyroid panel ordered  Hair loss Thyroid panel ordered Last TSH 10/31/17- 0.580   Anxiety Pt's face is flushed and he appears quite anxious/nervous   C. difficile enteritis Encourage to follow GI recommendation of having colonoscopy  FOLLOW-UP:  Return if symptoms worsen or fail to improve.

## 2018-02-02 NOTE — Assessment & Plan Note (Signed)
Encourage to follow GI recommendation of having colonoscopy

## 2018-02-03 ENCOUNTER — Other Ambulatory Visit: Payer: Self-pay | Admitting: Adult Health

## 2018-02-04 ENCOUNTER — Telehealth: Payer: Self-pay | Admitting: Adult Health

## 2018-02-04 LAB — TSH: TSH: 0.92 u[IU]/mL (ref 0.450–4.500)

## 2018-02-04 LAB — LIPID PANEL
CHOL/HDL RATIO: 3.6 ratio (ref 0.0–5.0)
Cholesterol, Total: 190 mg/dL (ref 100–199)
HDL: 53 mg/dL (ref >39)
LDL CALC: 127 mg/dL — AB (ref 0–99)
Triglycerides: 49 mg/dL (ref 0–149)
VLDL Cholesterol Cal: 10 mg/dL (ref 5–40)

## 2018-02-04 LAB — HEMOGLOBIN A1C
Est. average glucose Bld gHb Est-mCnc: 105 mg/dL
HEMOGLOBIN A1C: 5.3 % (ref 4.8–5.6)

## 2018-02-04 LAB — T3, FREE: T3, Free: 3.5 pg/mL (ref 2.0–4.4)

## 2018-02-04 LAB — T4, FREE: Free T4: 1.21 ng/dL (ref 0.82–1.77)

## 2018-02-04 NOTE — Telephone Encounter (Signed)
Let's proceed with [place] and if it is inconclusive, then we will proceed with barium swallow Thanks! Valetta Fuller

## 2018-02-04 NOTE — Telephone Encounter (Signed)
Denton Ar with [facility] needs to discuss a possible order change for patient, she wants to speak to the clinic staff about this possible change and her direct line is [phone]

## 2018-02-04 NOTE — Telephone Encounter (Signed)
Carlos Silva informed to proceed with [place].  Charyl Bigger, CMA

## 2018-02-04 NOTE — Telephone Encounter (Signed)
Denton Ar states that the tech advised that the US of the soft tissues of the neck will not look at the trachea or throat at all.  Do you want to change this order to a barium swallow, etc?  Please advise.  Pt is scheduled for tomorrow.  Charyl Bigger, CMA

## 2018-02-05 ENCOUNTER — Ambulatory Visit
Admission: RE | Admit: 2018-02-05 | Discharge: 2018-02-05 | Disposition: A | Discharge status: Home / Self Care | Payer: Managed Care, Other (non HMO) | Source: Ambulatory Visit | Attending: Adult Health | Admitting: Adult Health

## 2018-02-05 DIAGNOSIS — R1310 Dysphagia, unspecified: Secondary | ICD-10-CM

## 2018-02-10 ENCOUNTER — Other Ambulatory Visit: Payer: Managed Care, Other (non HMO)

## 2018-02-10 ENCOUNTER — Telehealth: Payer: Self-pay | Admitting: Adult Health

## 2018-02-10 NOTE — Telephone Encounter (Signed)
Patient states Carlos Silva called him w/ Ultrasound results & Katy's suggestion of ENT referral --unable to locate order,advised team member would research and call him back to tomorrow.  --forwarding message to medical assistant.  --Dion Body

## 2018-02-11 ENCOUNTER — Encounter (INDEPENDENT_AMBULATORY_CARE_PROVIDER_SITE_OTHER): Payer: Self-pay

## 2018-02-12 ENCOUNTER — Other Ambulatory Visit: Payer: Self-pay

## 2018-02-12 DIAGNOSIS — M899 Disorder of bone, unspecified: Secondary | ICD-10-CM

## 2018-02-12 NOTE — Telephone Encounter (Signed)
Order placed and [facility] has already obtained an appointment for the pt.  Charyl Bigger, CMA

## 2018-02-18 ENCOUNTER — Encounter: Payer: Self-pay | Admitting: Adult Health

## 2018-02-19 ENCOUNTER — Ambulatory Visit (INDEPENDENT_AMBULATORY_CARE_PROVIDER_SITE_OTHER): Payer: Managed Care, Other (non HMO) | Admitting: Adult Health

## 2018-02-19 ENCOUNTER — Encounter: Payer: Self-pay | Admitting: Adult Health

## 2018-02-19 VITALS — BP 128/70 | HR 77 | Ht 72.01 in | Wt 209.0 lb

## 2018-02-19 DIAGNOSIS — F419 Anxiety disorder, unspecified: Secondary | ICD-10-CM | POA: Diagnosis not present

## 2018-02-19 DIAGNOSIS — G8929 Other chronic pain: Secondary | ICD-10-CM

## 2018-02-19 DIAGNOSIS — L659 Nonscarring hair loss, unspecified: Secondary | ICD-10-CM | POA: Diagnosis not present

## 2018-02-19 DIAGNOSIS — R1310 Dysphagia, unspecified: Secondary | ICD-10-CM | POA: Diagnosis not present

## 2018-02-19 DIAGNOSIS — R1011 Right upper quadrant pain: Secondary | ICD-10-CM | POA: Diagnosis not present

## 2018-02-19 DIAGNOSIS — Z0000 Encounter for general adult medical examination without abnormal findings: Secondary | ICD-10-CM

## 2018-02-19 DIAGNOSIS — R413 Other amnesia: Secondary | ICD-10-CM | POA: Diagnosis not present

## 2018-02-19 NOTE — Assessment & Plan Note (Signed)
He has upcoming appt with ENT [place] of Neck 02/05/18: IMPRESSION: 1. No evidence of mass, abscess or lymphadenopathy. 2. The hyoid bone appears to be in the vicinity of the patient's clinical symptoms. There is slight asymmetry of the right aspect of the hyoid bone relative to the left which is not well evaluated by ultrasound. If clinical concern persists, recommend CT scan of the neck with intravenous contrast.

## 2018-02-19 NOTE — Assessment & Plan Note (Signed)
MMSE 29/30

## 2018-02-19 NOTE — Progress Notes (Signed)
Subjective:    Patient ID: Carlos Silva, male    DOB: 1986-06-10, 32 y.o.   MRN: [id number]  HPI: Carlos Silva is here for CPE He has several complaints today- 1) RUQ pain- intermittent that is described as numbness and a dull ache, rated 2/10 that will resolve after BM. He reports 2-5 BMs/day, that are "loose and flaky in texture". He denies hematuria/hemochezia. He is now open to colonoscopy, however he wants to change GI providers in [employer]. 2)Mental clarity issues, he states "I just feel foggy and my short term memory is really bad".  He also report short term memory deficits over the last few months.  He denies recent head injury. 3) Hair loss 4)Acute depression r/t "all my GI issues". He denies thoughts of harming himself/others. He declined starting SSRI or CBT referral  5) R neck pain- still present when he yawns. US Soft tissue neck ENT OV scheduled.  He estimates to drink >80 oz water a day and he reports dramatic improvement in appetite- wt today is 209 lbs, he was 185lbs in winater 2019. He has resumed regular exercise- weight lifting several days a week for about 1.5 weeks He again refused to complete PHQ screening  Healthcare Maintenance: Colonoscopy- STRONGLY RECOMMENDED due to acute GI sx's Immunizations- declined Tetanus vaccinatioin   Patient Care Team    Relationship Specialty Notifications Start End  Mina Marble D, NP PCP - General Family Medicine  11/17/17   Lin Landsman, MD Consulting Physician Gastroenterology  11/17/17     Patient Active Problem List   Diagnosis Date Noted  . Memory deficit 02/19/2018  . Chronic RUQ pain 02/19/2018  . Odynophagia 02/02/2018  . Hair loss 02/02/2018  . Anxiety 02/02/2018  . C. difficile enteritis 02/02/2018  . Healthcare maintenance 11/17/2017  . Abnormal C-reactive protein 11/17/2017     Past Medical History:  Diagnosis Date  . C. difficile enteritis      No past surgical history  on file.   Family History  Problem Relation Age of Onset  . Congestive Heart Failure Mother   . Vision loss Maternal Grandfather   . Cancer Paternal Grandfather      Social History   Substance and Sexual Activity  Drug Use No     Social History   Substance and Sexual Activity  Alcohol Use No  . Frequency: Never     Social History   Tobacco Use  Smoking Status Never Smoker  Smokeless Tobacco Never Used     Outpatient Encounter Medications as of 02/19/2018  Medication Sig  . [DISCONTINUED] cetirizine (ZYRTEC) 10 MG tablet Take 10 mg by mouth daily.  . [DISCONTINUED] fidaxomicin (DIFICID) 200 MG TABS tablet Take by mouth 2 (two) times daily.  . [DISCONTINUED] Multiple Vitamin (MULTIVITAMIN) tablet Take by mouth daily.  . [DISCONTINUED] traMADol (ULTRAM) 50 MG tablet Take 1 tablet (50 mg total) by mouth every 6 (six) hours as needed for up to 15 doses.   No facility-administered encounter medications on file as of 02/19/2018.     Allergies: Patient has no known allergies.  Body mass index is 28.34 kg/m.  Blood pressure 128/70, pulse 77, height 6' 0.01" (1.829 m), weight 209 lb (94.8 kg), SpO2 99 %.  Review of Systems  Constitutional: Positive for fatigue. Negative for activity change, appetite change, chills, diaphoresis and fever.  Eyes: Negative for visual disturbance.  Respiratory: Negative for cough, chest tightness, shortness of breath, wheezing and stridor.   Cardiovascular: Negative for  chest pain, palpitations and leg swelling.  Gastrointestinal: Positive for abdominal pain and diarrhea. Negative for abdominal distention, blood in stool, constipation, nausea and vomiting.  Endocrine: Negative for cold intolerance, heat intolerance, polydipsia and polyphagia.  Genitourinary: Negative for difficulty urinating and flank pain.  Musculoskeletal: Positive for arthralgias and myalgias.  Skin: Negative for color change, pallor, rash and wound.  Neurological:  Negative for dizziness and headaches.  Hematological: Does not bruise/bleed easily.  Psychiatric/Behavioral: Positive for dysphoric mood. Negative for decreased concentration, hallucinations, self-injury, sleep disturbance and suicidal ideas. The patient is not nervous/anxious and is not hyperactive.       Objective:   Physical Exam  Constitutional: He is oriented to person, place, and time. He appears well-developed and well-nourished. He appears distressed.  HENT:  Head: Normocephalic and atraumatic.  Right Ear: External ear normal. Tympanic membrane is not erythematous and not bulging. No decreased hearing is noted.  Left Ear: External ear normal. Tympanic membrane is not erythematous and not bulging. No decreased hearing is noted.  Nose: Nose normal. No mucosal edema. Right sinus exhibits no maxillary sinus tenderness and no frontal sinus tenderness. Left sinus exhibits no maxillary sinus tenderness and no frontal sinus tenderness.  Mouth/Throat: Oropharynx is clear and moist and mucous membranes are normal. No posterior oropharyngeal edema or posterior oropharyngeal erythema.  Eyes: Pupils are equal, round, and reactive to light. Conjunctivae and EOM are normal.  Neck: Normal range of motion. Neck supple.  Cardiovascular: Normal rate, regular rhythm, normal heart sounds and intact distal pulses.  No murmur heard. Pulmonary/Chest: Effort normal and breath sounds normal. No stridor. No respiratory distress. He has no wheezes. He has no rales. He exhibits no tenderness.  Abdominal: Soft. Bowel sounds are normal. He exhibits no distension and no mass. There is tenderness in the right upper quadrant. There is no rigidity, no rebound, no guarding, no CVA tenderness, no tenderness at McBurney's point and negative Murphy's sign. No hernia.  Musculoskeletal: Normal range of motion. He exhibits no edema.  Lymphadenopathy:    He has no cervical adenopathy.  Neurological: He is alert and oriented to  person, place, and time. Coordination normal.  Skin: Skin is warm and dry. Capillary refill takes less than 2 seconds. No rash noted. He is not diaphoretic. No erythema. No pallor.  Psychiatric: His speech is normal and behavior is normal. Judgment and thought content normal. His mood appears anxious. Cognition and memory are normal.  MMSE 29/30      Assessment & Plan:   1. Healthcare maintenance   2. Memory deficit   3. Hair loss   4. Chronic RUQ pain   5. Anxiety   6. Odynophagia     Healthcare maintenance Continue to drink plenty of water and follow heart healthy diet. Continue to increase regular exercise. We will call you when lab results are available. Please call your GI practice to set up office visit with different provider. Mini-Mental Status Exam (MMSE) 29/30- normal. Recommend annual physical with fasting labs.  Anxiety Discussed at length that stress/anxiety may be contributing to abdominal discomfort/diarrhea Discussed that SSRIs may with depression/anxiety and IBS type sx's He declined SSRI therapy He declined CBT referral   Chronic RUQ pain Advised that he needs to be seen by GI/complete recommended colonoscopy  Memory deficit MMSE 29/30  Odynophagia He has upcoming appt with ENT [place] of [place] 02/05/18: IMPRESSION: 1. No evidence of mass, abscess or lymphadenopathy. 2. The hyoid bone appears to be in the vicinity of the  patient's clinical symptoms. There is slight asymmetry of the right aspect of the hyoid bone relative to the left which is not well evaluated by ultrasound. If clinical concern persists, recommend CT scan of the neck with intravenous contrast.   Hair loss Last TSH/T4/T3 were all WNL B12 and Vit d level checked today Hair loss may be r/t to anxiety/depression   FOLLOW-UP:  Return in about 1 year (around 02/20/2019) for CPE, Fasting Labs.

## 2018-02-19 NOTE — Patient Instructions (Addendum)

## 2018-02-19 NOTE — Assessment & Plan Note (Signed)
Continue to drink plenty of water and follow heart healthy diet. Continue to increase regular exercise. We will call you when lab results are available. Please call your GI practice to set up office visit with different provider. Mini-Mental Status Exam (MMSE) 29/30- normal. Recommend annual physical with fasting labs.

## 2018-02-19 NOTE — Assessment & Plan Note (Signed)
Discussed at length that stress/anxiety may be contributing to abdominal discomfort/diarrhea Discussed that SSRIs may with depression/anxiety and IBS type sx's He declined SSRI therapy He declined CBT referral

## 2018-02-19 NOTE — Assessment & Plan Note (Signed)
Advised that he needs to be seen by GI/complete recommended colonoscopy

## 2018-02-19 NOTE — Assessment & Plan Note (Signed)
Last TSH/T4/T3 were all WNL B12 and Vit d level checked today Hair loss may be r/t to anxiety/depression

## 2018-02-20 ENCOUNTER — Other Ambulatory Visit: Payer: Self-pay | Admitting: Adult Health

## 2018-02-20 DIAGNOSIS — E559 Vitamin D deficiency, unspecified: Secondary | ICD-10-CM

## 2018-02-20 LAB — VITAMIN B12: Vitamin B-12: 466 pg/mL (ref 232–1245)

## 2018-02-20 LAB — VITAMIN D 25 HYDROXY (VIT D DEFICIENCY, FRACTURES): Vit D, 25-Hydroxy: 27.7 ng/mL — ABNORMAL LOW (ref 30.0–100.0)

## 2018-02-20 MED ORDER — VITAMIN D (ERGOCALCIFEROL) 1.25 MG (50000 UNIT) PO CAPS: 50000.0000 [IU] | ORAL_CAPSULE | ORAL | 0 refills | Status: DC

## 2018-02-20 NOTE — Progress Notes (Signed)
Vit d def treated with once weekly therapy for 16 weeks

## 2018-02-25 ENCOUNTER — Telehealth: Payer: Self-pay | Admitting: Adult Health

## 2018-02-25 ENCOUNTER — Telehealth: Payer: Self-pay | Admitting: Gastroenterology

## 2018-02-25 NOTE — Telephone Encounter (Signed)
Pt is calling to set up colonoscopy with  Dr. Allen Norris

## 2018-02-25 NOTE — Telephone Encounter (Signed)
Dr. Marius Ditch, patient contacted office requesting to schedule his colonoscopy with Dr. Allen Norris.  I've not scheduled this patient with Dr. Allen Norris, because he is your established patient and you discussed the need for colonoscopy with him during his last office visit on 12/01/17. I don't know his reason for wanting to schedule with Dr. Allen Norris- I received a note regarding it.  Please advise.

## 2018-02-25 NOTE — Telephone Encounter (Signed)
patient called states rcvd message from [facility] to call office---forwarding message to medical assistant.  --Dion Body

## 2018-02-26 ENCOUNTER — Other Ambulatory Visit: Payer: Self-pay

## 2018-02-26 DIAGNOSIS — R197 Diarrhea, unspecified: Secondary | ICD-10-CM

## 2018-02-26 NOTE — Telephone Encounter (Signed)
Patient has been scheduled for a colonoscopy on 03/17/18 with Dr. Allen Norris @ [facility]

## 2018-03-11 ENCOUNTER — Encounter: Payer: Self-pay | Admitting: Gastroenterology

## 2018-03-11 NOTE — Telephone Encounter (Signed)
Please advise on whether or not patient can have clear pedialyte for prep.  Thanks [employer]

## 2018-03-16 ENCOUNTER — Encounter: Payer: Self-pay | Admitting: *Deleted

## 2018-03-17 ENCOUNTER — Ambulatory Visit: Payer: Managed Care, Other (non HMO) | Admitting: Certified Registered"

## 2018-03-17 ENCOUNTER — Encounter
Admission: RE | Disposition: A | Discharge status: Home / Self Care | Payer: Self-pay | Source: Ambulatory Visit | Attending: Gastroenterology

## 2018-03-17 ENCOUNTER — Ambulatory Visit
Admission: RE | Admit: 2018-03-17 | Discharge: 2018-03-17 | Disposition: A | Discharge status: Home / Self Care | Payer: Managed Care, Other (non HMO) | Source: Ambulatory Visit | Attending: Gastroenterology | Admitting: Gastroenterology

## 2018-03-17 DIAGNOSIS — K529 Noninfective gastroenteritis and colitis, unspecified: Secondary | ICD-10-CM | POA: Insufficient documentation

## 2018-03-17 DIAGNOSIS — E559 Vitamin D deficiency, unspecified: Secondary | ICD-10-CM | POA: Insufficient documentation

## 2018-03-17 DIAGNOSIS — Z79899 Other long term (current) drug therapy: Secondary | ICD-10-CM | POA: Diagnosis not present

## 2018-03-17 DIAGNOSIS — R197 Diarrhea, unspecified: Secondary | ICD-10-CM

## 2018-03-17 HISTORY — DX: Other bacterial infections of unspecified site: A49.8

## 2018-03-17 HISTORY — PX: COLONOSCOPY WITH PROPOFOL: SHX5780

## 2018-03-17 HISTORY — DX: Vitamin D deficiency, unspecified: E55.9

## 2018-03-17 SURGERY — COLONOSCOPY WITH PROPOFOL
Anesthesia: General

## 2018-03-17 MED ORDER — MIDAZOLAM HCL 2 MG/2ML IJ SOLN
INTRAMUSCULAR | Status: AC
  Filled 2018-03-17: qty 2

## 2018-03-17 MED ORDER — PROPOFOL 500 MG/50ML IV EMUL
INTRAVENOUS | Status: DC | PRN
  Administered 2018-03-17: 140 ug/kg/min via INTRAVENOUS

## 2018-03-17 MED ORDER — SODIUM CHLORIDE 0.9 % IV SOLN
INTRAVENOUS | Status: DC
  Administered 2018-03-17: 10:00:00 via INTRAVENOUS

## 2018-03-17 MED ORDER — PROPOFOL 500 MG/50ML IV EMUL
INTRAVENOUS | Status: AC
  Filled 2018-03-17: qty 50

## 2018-03-17 MED ORDER — PROPOFOL 10 MG/ML IV BOLUS
INTRAVENOUS | Status: DC | PRN
  Administered 2018-03-17: 10 mg via INTRAVENOUS
  Administered 2018-03-17: 90 mg via INTRAVENOUS

## 2018-03-17 MED ORDER — MIDAZOLAM HCL 2 MG/2ML IJ SOLN
INTRAMUSCULAR | Status: DC | PRN
  Administered 2018-03-17: 2 mg via INTRAVENOUS

## 2018-03-17 MED ORDER — LIDOCAINE HCL (PF) 2 % IJ SOLN
INTRAMUSCULAR | Status: AC
  Filled 2018-03-17: qty 10

## 2018-03-17 MED ORDER — LIDOCAINE HCL (CARDIAC) PF 100 MG/5ML IV SOSY
PREFILLED_SYRINGE | INTRAVENOUS | Status: DC | PRN
  Administered 2018-03-17: 50 mg via INTRAVENOUS

## 2018-03-17 NOTE — Anesthesia Postprocedure Evaluation (Signed)
Anesthesia Post Note  Patient: Carlos Silva  Procedure(s) Performed: COLONOSCOPY WITH PROPOFOL (N/A )  Patient location during evaluation: Endoscopy Anesthesia Type: General Level of consciousness: awake and alert Pain management: pain level controlled Vital Signs Assessment: post-procedure vital signs reviewed and stable Respiratory status: spontaneous breathing, nonlabored ventilation, respiratory function stable and patient connected to nasal cannula oxygen Cardiovascular status: blood pressure returned to baseline and stable Postop Assessment: no apparent nausea or vomiting Anesthetic complications: no     Last Vitals:  Vitals:   03/17/18 1012 03/17/18 1022  BP: 110/73 113/72  Pulse: 77 67  Resp: 17 19  Temp:    SpO2: 100% 100%    Last Pain:  Vitals:   03/17/18 1022  TempSrc:   PainSc: 0-No pain                 Martha Clan

## 2018-03-17 NOTE — Anesthesia Preprocedure Evaluation (Addendum)
Anesthesia Evaluation  Patient identified by MRN, date of birth, ID band Patient awake    Reviewed: Allergy & Precautions, H&P , NPO status , Patient's Chart, lab work & pertinent test results, reviewed documented beta blocker date and time   History of Anesthesia Complications Negative for: history of anesthetic complications  Airway Mallampati: II  TM Distance: >3 FB Neck ROM: full    Dental  (+) Dental Advidsory Given   Pulmonary neg pulmonary ROS,           Cardiovascular Exercise Tolerance: Good negative cardio ROS       Neuro/Psych negative neurological ROS  negative psych ROS   GI/Hepatic Neg liver ROS, C. Diff enteritis   Endo/Other  negative endocrine ROS  Renal/GU negative Renal ROS  negative genitourinary   Musculoskeletal   Abdominal   Peds  Hematology negative hematology ROS (+)   Anesthesia Other Findings Past Medical History: No date: C. difficile enteritis No date: Clostridium difficile infection No date: Vitamin D deficiency   Reproductive/Obstetrics negative OB ROS                             Anesthesia Physical Anesthesia Plan  ASA: II  Anesthesia Plan: General   Post-op Pain Management:    Induction: Intravenous  PONV Risk Score and Plan: 2 and Propofol infusion and TIVA  Airway Management Planned: Nasal Cannula and Natural Airway  Additional Equipment:   Intra-op Plan:   Post-operative Plan:   Informed Consent: I have reviewed the patients History and Physical, chart, labs and discussed the procedure including the risks, benefits and alternatives for the proposed anesthesia with the patient or authorized representative who has indicated his/her understanding and acceptance.   Dental Advisory Given  Plan Discussed with: Anesthesiologist, CRNA and Surgeon  Anesthesia Plan Comments:         Anesthesia Quick Evaluation

## 2018-03-17 NOTE — H&P (Signed)
Lucilla Lame, MD [address] [place], [place] [address] [phone] Fax : [phone]  Primary Care Physician:  Esaw Grandchild, NP Primary Gastroenterologist:  Dr. Allen Norris  Pre-Procedure History & Physical: HPI:  Carlos Silva is a 32 y.o. male is here for an colonoscopy.   Past Medical History:  Diagnosis Date  . C. difficile enteritis   . Clostridium difficile infection   . Vitamin D deficiency     Past Surgical History:  Procedure Laterality Date  . wisdopm tooth extrac      Prior to Admission medications   Medication Sig Start Date End Date Taking? Authorizing Provider  Vitamin D, Ergocalciferol, (DRISDOL) 50000 units CAPS capsule Take 1 capsule (50,000 Units total) by mouth every 7 (seven) days. 02/20/18   Mina Marble D, NP    Allergies as of 02/26/2018  . (No Known Allergies)    Family History  Problem Relation Age of Onset  . Congestive Heart Failure Mother   . Vision loss Maternal Grandfather   . Cancer Paternal Grandfather     Social History   Socioeconomic History  . Marital status: Married    Spouse name: Carlos Silva  . Number of children: 1  . Years of education: Not on file  . Highest education level: Not on file  Occupational History  . Occupation: Presenter, broadcasting    Comment: [employer]  . Financial resource strain: Not on file  . Food insecurity:    Worry: Not on file    Inability: Not on file  . Transportation needs:    Medical: Not on file    Non-medical: Not on file  Tobacco Use  . Smoking status: Never Smoker  . Smokeless tobacco: Never Used  Substance and Sexual Activity  . Alcohol use: Yes    Frequency: Never    Comment: occasional. none last 24hrs  . Drug use: No  . Sexual activity: Yes    Partners: Female    Comment: wife  Lifestyle  . Physical activity:    Days per week: Not on file    Minutes per session: Not on file  . Stress: Not on file  Relationships  . Social  connections:    Talks on phone: Not on file    Gets together: Not on file    Attends religious service: Not on file    Active member of club or organization: Not on file    Attends meetings of clubs or organizations: Not on file    Relationship status: Not on file  . Intimate partner violence:    Fear of current or ex partner: Not on file    Emotionally abused: Not on file    Physically abused: Not on file    Forced sexual activity: Not on file  Other Topics Concern  . Not on file  Social History Narrative  . Not on file    Review of Systems: See HPI, otherwise negative ROS  Physical Exam: BP 120/79   Pulse 64   Temp 97.8 F (36.6 C) (Tympanic)   Resp 20   Ht 6' (1.829 m)   Wt 209 lb (94.8 kg)   SpO2 100%   BMI 28.35 kg/m  General:   Alert,  pleasant and cooperative in NAD Head:  Normocephalic and atraumatic. Neck:  Supple; no masses or thyromegaly. Lungs:  Clear throughout to auscultation.    Heart:  Regular rate and rhythm. Abdomen:  Soft, nontender and nondistended. Normal bowel sounds,  without guarding, and without rebound.   Neurologic:  Alert and  oriented x4;  grossly normal neurologically.  Impression/Plan: Carlos Silva is here for an colonoscopy to be performed for diarrhea  Risks, benefits, limitations, and alternatives regarding  colonoscopy have been reviewed with the patient.  Questions have been answered.  All parties agreeable.   Lucilla Lame, MD  03/17/2018, 9:35 AM

## 2018-03-17 NOTE — Op Note (Signed)
[facility] Gastroenterology Patient Name: Jedadiah Abdallah Procedure Date: 03/17/2018 9:45 AM MRN: [id number] Account #: [id number] Date of Birth: 08/27/1986 Admit Type: Outpatient Age: 32 Room: [facility] ENDO ROOM 4 Gender: Male Note Status: Finalized Procedure:            Colonoscopy Indications:          Chronic diarrhea Providers:            Lucilla Lame MD, MD Referring MD:         No Local Md, MD (Referring MD) Medicines:            Monitored Anesthesia Care Complications:        No immediate complications. Procedure:            Pre-Anesthesia Assessment:                       - Prior to the procedure, a History and Physical was                        performed, and patient medications and allergies were                        reviewed. The patient's tolerance of previous                        anesthesia was also reviewed. The risks and benefits of                        the procedure and the sedation options and risks were                        discussed with the patient. All questions were                        answered, and informed consent was obtained. Prior                        Anticoagulants: The patient has taken no previous                        anticoagulant or antiplatelet agents. ASA Grade                        Assessment: II - A patient with mild systemic disease.                        After reviewing the risks and benefits, the patient was                        deemed in satisfactory condition to undergo the                        procedure.                       After obtaining informed consent, the colonoscope was                        passed under direct vision. Throughout the procedure,  the patient's blood pressure, pulse, and oxygen                        saturations were monitored continuously. The                        Colonoscope was introduced through the anus and                        advanced to the the  terminal ileum. The colonoscopy was                        performed without difficulty. The patient tolerated the                        procedure well. The quality of the bowel preparation                        was excellent. Findings:      The perianal and digital rectal examinations were normal.      The colon (entire examined portion) appeared normal. Random biopsies       were obtained with cold forceps for histology randomly in the entire       colon.      The terminal ileum appeared normal. Biopsies were taken with a cold       forceps for histology. Impression:           - The entire examined colon is normal.                       - The examined portion of the ileum was normal.                        Biopsied.                       - Random biopsies were obtained in the entire colon. Recommendation:       - Discharge patient to home.                       - Resume previous diet.                       - Continue present medications.                       - Await pathology results. Procedure Code(s):    --- Professional ---                       [phone], Colonoscopy, flexible; with biopsy, single or                        multiple Diagnosis Code(s):    --- Professional ---                       K52.9, Noninfective gastroenteritis and colitis,                        unspecified CPT copyright 2017 American Medical Association. All rights reserved. The codes documented in this report are preliminary and upon coder review may  be revised  to meet current compliance requirements. Lucilla Lame MD, MD 03/17/2018 10:00:45 AM This report has been signed electronically. Number of Addenda: 0 Note Initiated On: 03/17/2018 9:45 AM Scope Withdrawal Time: 0 hours 6 minutes 55 seconds  Total Procedure Duration: 0 hours 8 minutes 24 seconds       [facility]

## 2018-03-17 NOTE — Transfer of Care (Signed)
Immediate Anesthesia Transfer of Care Note  Patient: Carlos Silva  Procedure(s) Performed: COLONOSCOPY WITH PROPOFOL (N/A )  Patient Location: PACU  Anesthesia Type:General  Level of Consciousness: sedated  Airway & Oxygen Therapy: Patient Spontanous Breathing and Patient connected to nasal cannula oxygen  Post-op Assessment: Report given to RN and Post -op Vital signs reviewed and stable  Post vital signs: Reviewed and stable  Last Vitals:  Vitals Value Taken Time  BP    Temp    Pulse    Resp    SpO2      Last Pain:  Vitals:   03/17/18 0910  TempSrc: Tympanic         Complications: No apparent anesthesia complications

## 2018-03-17 NOTE — Anesthesia Post-op Follow-up Note (Signed)
Anesthesia QCDR form completed.        

## 2018-03-17 NOTE — Anesthesia Procedure Notes (Signed)
Performed by: Lori-Ann Lindfors, CRNA Pre-anesthesia Checklist: Patient identified, Emergency Drugs available, Suction available, Patient being monitored and Timeout performed Patient Re-evaluated:Patient Re-evaluated prior to induction Oxygen Delivery Method: Nasal cannula Induction Type: IV induction       

## 2018-03-18 ENCOUNTER — Encounter: Payer: Self-pay | Admitting: Gastroenterology

## 2018-03-18 LAB — SURGICAL PATHOLOGY

## 2018-03-20 ENCOUNTER — Telehealth: Payer: Self-pay

## 2018-03-20 NOTE — Telephone Encounter (Signed)
Dr. Marius Ditch, pt call today (Friday) and stated he was told to call our office if he started having any pain. He is currently having some stabbing pain that doesn't last long. It has happened about 4 times today so far. No fiver, chills, or bloody diarrhea. Still having diarrhea though. I also read to him the results that was sent by Dr. Allen Norris. Please advise.

## 2018-03-25 ENCOUNTER — Encounter: Payer: Self-pay | Admitting: Gastroenterology

## 2018-04-14 ENCOUNTER — Encounter: Payer: Self-pay | Admitting: Gastroenterology

## 2018-04-14 ENCOUNTER — Ambulatory Visit (INDEPENDENT_AMBULATORY_CARE_PROVIDER_SITE_OTHER): Payer: Managed Care, Other (non HMO) | Admitting: Gastroenterology

## 2018-04-14 VITALS — BP 122/76 | HR 69 | Resp 17 | Ht 72.0 in | Wt 214.8 lb

## 2018-04-14 DIAGNOSIS — K580 Irritable bowel syndrome with diarrhea: Secondary | ICD-10-CM | POA: Diagnosis not present

## 2018-04-14 MED ORDER — RIFAXIMIN 550 MG PO TABS: 550.0000 mg | ORAL_TABLET | Freq: Two times a day (BID) | ORAL | 0 refills | Status: AC

## 2018-04-14 NOTE — Progress Notes (Signed)
Cephas Darby, MD [address]  [place], [place] [address]  Main: [phone]  Fax: [phone]    Gastroenterology Consultation  Referring Provider:     Esaw Grandchild, NP Primary Care Physician:  Esaw Grandchild, NP Primary Gastroenterologist:  Dr. Cephas Darby Reason for Consultation:     Chronic bloody diarrhea        HPI:   Carlos Silva is a 32 y.o. Caucasian male referred by Dr. Loleta Books, Berna Spare, NP  for consultation & management of chronic bloody diarrhea. He has been experiencing diarrhea since October, 2018. His symptoms started initially with sore throat, abdominal pain, diarrhea, diagnosed as strep throat for which he received penicillin derived antibiotic. Subsequently he developed severe bloody diarrhea, 10-12 times per day associated with urgency, underwent stool studies including C. difficile which came back negative. He received 10 days course of metronidazole when he went to urgent care due to worsening of diarrhea. Patient reports that the initial stool studies were performed after he received metronidazole. His diarrhea subsided with antibiotics. Subsequently, he had cut on his finger at work for which he received tetanus and diphtheria vaccine. He reports that diarrhea returned after he received vaccine, he had blood in stool, lower abdominal discomfort, weight loss, rash on his body, predominantly hives associated with itching, as well as red spots on his palms and around joints, he shared pictures of his rash on his phone, severe arthralgias of small and large joints associated with weakness, limping. He took Diflucan for rash, thought it might be fungal. He lost about 30 pounds during this entire course. He limited po intake secondary to severe diarrhea. He reports having 4-5 bowel movements per day, predominantly nonbloody, sometimes sees streaks of blood. He had stool studies repeated twice including C. difficile due to ongoing diarrhea and came back  negative. Norovirus was negative. TSH was normal. He most recently had CBC, CMP which were unremarkable. H. pylori stool antigen came back negative as well. He has good appetite, but unable to eat due to diarrhea. He modified his diet, avoiding milk, added probiotics. He denies smoking or alcohol. He is taking Zyrtec whenever he develops hives  He is accompanied by his wife today who is concerned about parasitic infection given his family history. His mother who was initially thought to have ulcerative colitis and underwent colectomy, eventually found to have strongyloidiasis infection. His mother's sister had history of Entamoeba histolytica. Patient's wife is concerned that the parasites are not detected in stool and requesting for blood tests to evaluate for strongyloidiasis. 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  Follow-up visit 12/01/2017 Since last visit, patient had stool studies, resulted positive for C. Difficile toxin, completed 10 days course of dificid. His diarrhea has significantly resolved and overall his weight has been stable. His appetite has improved and able to tolerate some foods. However, he has several questions today along with his wife and concerned about ongoing rash and intolerance to several foods such as processed foods, lactose products. He is still concerned about developing rash after eating certain foods and has to take antihistamine medications. He no longer has blood in stools, his bowels are mostly soft in consistency, up to 3-4 times daily. 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 He does have mild abdominal discomfort especially  before having a bowel movement. He had repeat CRP at rheumatologist's office and came back normal. Strongyloides antibodies came back negative, fecal calprotectin came  back normal. Both patient and his wife are concerned about the residual symptoms and wanted to know if he is recovering from C. Difficile or if he has alternative pathology going on. He is resistant to undergo colonoscopy at this point and concerned about taking biopsies leading to complications. His wife wanted to know if he could be evaluated for systemic mastocytosis.  Follow up visit 04/14/2018 He underwent colonoscopy by Dr. Allen Norris and it was unremarkable including terminal ileum. Biopsies were unremarkable. He is most bothered by symptoms of brain fog and short-term memory loss. With regards to his GI symptoms which were predominantly diarrhea, abdominal discomfort have significantly improved. He is worried about variable stool consistency, however the stool frequency is 1-2 per day on average. He is taking over-the-counter probiotics as needed as well as added vanilla yogurt to his diet. He has flare up of symptoms most triggered after eating certain foods. He is also worried about hair fall and started taking multivitamin and mineral supplements, which has slowed it down. He has not tried Imodium or Bentyl for diarrhea. He gained 20 pounds in last 4 months, he was 195 pounds in 12/2017, today he weighs 214 pounds. He incorporated regular exercise and eating more protein. He reports that he has mental clarity when he eats burger. He is requesting FMLA for his condition.  NSAIDs: None  Antiplts/Anticoagulants/Anti thrombotics: None  GI Procedures:  Colonoscopy by Dr. Allen Norris - The entire examined colon is normal. - The examined portion of the ileum was normal. Biopsied. - Random biopsies were obtained in the entire colon. DIAGNOSIS:  A. TERMINAL ILEUM; COLD BIOPSY:  - PROMINENT LYMPHOID AGGREGATES OF SMALL BOWEL MUCOSA.   B. RANDOM COLON; COLD BIOPSY:  - COLONIC MUCOSA NEGATIVE FOR MICROSCOPIC COLITIS, DYSPLASIA AND  MALIGNANCY.   No prior GI surgeries No known family history of GI  malignancy No recent travel   Past Medical History:  Diagnosis Date  . C. difficile enteritis   . Clostridium difficile infection   . Vitamin D deficiency     Current Outpatient Medications:  Marland Kitchen  Vitamin D, Ergocalciferol, (DRISDOL) 50000 units CAPS capsule, Take 1 capsule (50,000 Units total) by mouth every 7 (seven) days., Disp: 16 capsule, Rfl: 0 .  rifaximin (XIFAXAN) 550 MG TABS tablet, Take 1 tablet (550 mg total) by mouth 2 (two) times daily for 14 days., Disp: 28 tablet, Rfl: 0   Family History  Problem Relation Age of Onset  . Congestive Heart Failure Mother   . Vision loss Maternal Grandfather   . Cancer Paternal Grandfather      Social History   Tobacco Use  . Smoking status: Never Smoker  . Smokeless tobacco: Never Used  Substance Use Topics  . Alcohol use: Yes    Frequency: Never    Comment: occasional. none last 24hrs  . Drug use: No    Allergies as of 04/14/2018  . (No Known Allergies)    Review of Systems:    All systems reviewed and negative except where noted in HPI.   Physical Exam:  BP 122/76 (BP Location: Left Arm, Patient Position: Sitting, Cuff Size: Large)   Pulse 69   Resp 17   Ht 6' (1.829 m)   Wt 214 lb 12.8 oz (97.4 kg)   BMI 29.13 kg/m  No LMP for male patient.  General:  Alert,  Well-developed, well-nourished, pleasant and cooperative in NAD Head:  Normocephalic and atraumatic. Eyes:  Sclera clear, no icterus.   Conjunctiva pink. Ears:  Normal auditory acuity. Nose:  No deformity, discharge, or lesions. Mouth:  No deformity or lesions,oropharynx pink & moist. Neck:  Supple; no masses or thyromegaly. Lungs:  Respirations even and unlabored.  Clear throughout to auscultation.   No wheezes, crackles, or rhonchi. No acute distress. Heart:  Regular rate and rhythm; no murmurs, clicks, rubs, or gallops. Abdomen:  Normal bowel sounds. Soft, non-tender and non-distended without masses, hepatosplenomegaly or hernias noted.  No guarding  or rebound tenderness.   Rectal: Not performed Msk:  Symmetrical without gross deformities. Good, equal movement & strength bilaterally. Pulses:  Normal pulses noted. Extremities:  No clubbing or edema.  No cyanosis. Neurologic:  Alert and oriented x3;  grossly normal neurologically. Skin:  Intact without significant lesions or rashes. No jaundice. Lymph Nodes:  No significant cervical adenopathy. Psych:  Alert and cooperative. Normal mood and affect.  Imaging Studies: No abdominal imaging  Assessment and Plan:   Carlos Silva is a 32 y.o. Caucasian  male with chronic diarrhea and weight loss associated with rash, arthritis. His symptoms started after strep throat infection and was treated with antibiotics. His diarrhea temporarily responded to short course of metronidazole. Workup revealed C. Difficile infection and underwent treatment with dificid x 10days. His diarrhea has resolved post treatment. He gained 20 pounds in last 4 months. He continues to have mild GI symptoms, brain fog, short-term memory deficit, arthralgias and intolerance to several foods. His CRP has normalized which is reassuring. His residual GI symptoms are probably secondary to recent C. Difficile infection and currently dealing with postinfectious IBS. I had previously cncouraged him to try probiotics, avoid lactose, processed foods. Due to residual GI symptoms and symptoms of brain fog and memory disturbances, patient was interested in undergoing further testing to look for alternative etiologies. Serum tryptase levels were normal. Celiac serologies came back normal. C1 esterase inhibitor normal. Colonoscopy was normal including terminal ileum and entire colon, biopsies of the TI and colon were unremarkable. I discussed with him about several management options of postinfectious IBS including the role of low FODMAPS diet, rifaximin,IB guard, probiotics, cognitive behavioral therapy, tricyclic antidepressants  - Patient is  interested to try 2 weeks course of rifaximin - I also gave him information about low-FODMAPS diet as his symptoms are mostly driven after eating certain foods - encouraged him to try Biotin and zinc supplements for hair fall - advised him to consider seeking expert opinion with Dr Mathews Robinsons who is the IBS specialist in the area. Provided him with his contact information   I spent a total of 45 minutes of which more than 50% was spent in counseling and coordination of care regarding ongoing GI symptoms, recovery from C. 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

## 2018-04-30 ENCOUNTER — Encounter: Payer: Self-pay | Admitting: Gastroenterology

## 2018-06-01 ENCOUNTER — Ambulatory Visit: Payer: Managed Care, Other (non HMO) | Admitting: Gastroenterology

## 2019-06-01 ENCOUNTER — Ambulatory Visit: Payer: Managed Care, Other (non HMO) | Admitting: Adult Health

## 2019-07-06 ENCOUNTER — Other Ambulatory Visit: Payer: Self-pay

## 2019-07-06 ENCOUNTER — Other Ambulatory Visit: Payer: Managed Care, Other (non HMO)

## 2019-07-06 DIAGNOSIS — L659 Nonscarring hair loss, unspecified: Secondary | ICD-10-CM

## 2019-07-06 DIAGNOSIS — Z0000 Encounter for general adult medical examination without abnormal findings: Secondary | ICD-10-CM

## 2019-07-07 LAB — CBC WITH DIFFERENTIAL/PLATELET
Basophils Absolute: 0 10*3/uL (ref 0.0–0.2)
Basos: 1 %
EOS (ABSOLUTE): 0.3 10*3/uL (ref 0.0–0.4)
Eos: 5 %
Hematocrit: 44.8 % (ref 37.5–51.0)
Hemoglobin: 15.5 g/dL (ref 13.0–17.7)
Immature Grans (Abs): 0.1 10*3/uL (ref 0.0–0.1)
Immature Granulocytes: 1 %
Lymphocytes Absolute: 2.2 10*3/uL (ref 0.7–3.1)
Lymphs: 37 %
MCH: 30.5 pg (ref 26.6–33.0)
MCHC: 34.6 g/dL (ref 31.5–35.7)
MCV: 88 fL (ref 79–97)
Monocytes Absolute: 0.5 10*3/uL (ref 0.1–0.9)
Monocytes: 9 %
Neutrophils Absolute: 2.7 10*3/uL (ref 1.4–7.0)
Neutrophils: 47 %
Platelets: 269 10*3/uL (ref 150–450)
RBC: 5.09 x10E6/uL (ref 4.14–5.80)
RDW: 12.4 % (ref 11.6–15.4)
WBC: 5.8 10*3/uL (ref 3.4–10.8)

## 2019-07-07 LAB — COMPREHENSIVE METABOLIC PANEL
ALT: 19 IU/L (ref 0–44)
AST: 21 IU/L (ref 0–40)
Albumin/Globulin Ratio: 2.1 (ref 1.2–2.2)
Albumin: 4.6 g/dL (ref 4.0–5.0)
Alkaline Phosphatase: 56 IU/L (ref 39–117)
BUN/Creatinine Ratio: 14 (ref 9–20)
BUN: 13 mg/dL (ref 6–20)
Bilirubin Total: 0.5 mg/dL (ref 0.0–1.2)
CO2: 23 mmol/L (ref 20–29)
Calcium: 9.1 mg/dL (ref 8.7–10.2)
Chloride: 101 mmol/L (ref 96–106)
Creatinine, Ser: 0.95 mg/dL (ref 0.76–1.27)
GFR calc Af Amer: 121 mL/min/{1.73_m2} (ref >59)
GFR calc non Af Amer: 105 mL/min/{1.73_m2} (ref >59)
Globulin, Total: 2.2 g/dL (ref 1.5–4.5)
Glucose: 100 mg/dL — ABNORMAL HIGH (ref 65–99)
Potassium: 5.1 mmol/L (ref 3.5–5.2)
Sodium: 138 mmol/L (ref 134–144)
Total Protein: 6.8 g/dL (ref 6.0–8.5)

## 2019-07-07 LAB — LIPID PANEL
Chol/HDL Ratio: 3.8 ratio (ref 0.0–5.0)
Cholesterol, Total: 192 mg/dL (ref 100–199)
HDL: 51 mg/dL (ref >39)
LDL Chol Calc (NIH): 128 mg/dL — ABNORMAL HIGH (ref 0–99)
Triglycerides: 70 mg/dL (ref 0–149)
VLDL Cholesterol Cal: 13 mg/dL (ref 5–40)

## 2019-07-07 LAB — TSH: TSH: 0.706 u[IU]/mL (ref 0.450–4.500)

## 2019-07-07 LAB — HEMOGLOBIN A1C
Est. average glucose Bld gHb Est-mCnc: 114 mg/dL
Hgb A1c MFr Bld: 5.6 % (ref 4.8–5.6)

## 2019-07-12 NOTE — Progress Notes (Signed)
Subjective:    Patient ID: Carlos Silva, male    DOB: 25-Feb-1986, 33 y.o.   MRN: [id number]  HPI:  02/19/2018 OV: Carlos Silva is here for CPE He has several complaints today- 1) RUQ pain- intermittent that is described as numbness and a dull ache, rated 2/10 that will resolve after BM. He reports 2-5 BMs/day, that are "loose and flaky in texture". He denies hematuria/hemochezia. He is now open to colonoscopy, however he wants to change GI providers in [place]. 2)Mental clarity issues, he states "I just feel foggy and my short term memory is really bad".  He also report short term memory deficits over the last few months.  He denies recent head injury. 3) Hair loss 4)Acute depression r/t "all my GI issues". He denies thoughts of harming himself/others. He declined starting SSRI or CBT referral  5) R neck pain- still present when he yawns. US Soft tissue neck ENT OV scheduled.  He estimates to drink >80 oz water a day and he reports dramatic improvement in appetite- wt today is 209 lbs, he was 185lbs in winater 2019. He has resumed regular exercise- weight lifting several days a week for about 1.5 weeks He again refused to complete PHQ screening  Healthcare Maintenance: Colonoscopy- STRONGLY RECOMMENDED due to acute GI sx's Immunizations- declined Tetanus vaccinatioin  07/13/2019 OV: Carlos Silva is here for CPE. He denies acute complaints today. He estimates to drink 50 oz water/day, tries to follow heart healthy diet, but reports eating butter frequently. He denies any formal exercise. He has gained >20 lbs since last OV 8/13/209 Current wt 235 Body mass index is 31.93 kg/m. He denies depression/anxiety sx's.  03/2018 GI OV: Patient is interested to try 2 weeks course of rifaximin - I also gave him information about low-FODMAPS diet as his symptoms are mostly driven after eating certain foods - encouraged him to try Biotin and zinc supplements for hair  fall - advised him to consider seeking expert opinion with Dr Mathews Robinsons who is the IBS specialist in the area. Provided him with his contact information  07/06/2019 Labs: TSH-WNL, 0.706  A1c-WNL, 5.6  CMP-WNL  CBC-WNL  Lipid Panel-  Total-192  TGs-70  HDL-51  LDL-128, LDL-127 last year   Healthcare Maintenance: Immunizations-declined influenza vaccination today. Colonoscopy-03/17/2018-Colonoscopy was normal including terminal ileum and entire colon, biopsies of the TI and colon were unremarkable.  Patient Care Team    Relationship Specialty Notifications Start End  Mina Marble D, NP PCP - General Family Medicine  11/17/17   Lin Landsman, MD Consulting Physician Gastroenterology  11/17/17     Patient Active Problem List   Diagnosis Date Noted  . BMI 31.0-31.9,adult 07/13/2019  . Noninfectious diarrhea   . Memory deficit 02/19/2018  . Chronic RUQ pain 02/19/2018  . Hair loss 02/02/2018  . Anxiety 02/02/2018  . Healthcare maintenance 11/17/2017     Past Medical History:  Diagnosis Date  . Abnormal C-reactive protein 11/17/2017  . C. difficile enteritis   . Clostridium difficile infection   . Odynophagia 02/02/2018  . Vitamin D deficiency      Past Surgical History:  Procedure Laterality Date  . COLONOSCOPY WITH PROPOFOL N/A 03/17/2018   Procedure: COLONOSCOPY WITH PROPOFOL;  Surgeon: Lucilla Lame, MD;  Location: [facility] ENDOSCOPY;  Service: Endoscopy;  Laterality: N/A;  . wisdopm tooth extrac       Family History  Problem Relation Age of Onset  . Congestive Heart Failure Mother   . Vision  loss Maternal Grandfather   . Cancer Paternal Grandfather      Social History   Substance and Sexual Activity  Drug Use No     Social History   Substance and Sexual Activity  Alcohol Use Yes  . Frequency: Never   Comment: occasional. none last 24hrs     Social History   Tobacco Use  Smoking Status Never Smoker  Smokeless Tobacco Never Used      Outpatient Encounter Medications as of 07/13/2019  Medication Sig  . [DISCONTINUED] Vitamin D, Ergocalciferol, (DRISDOL) 50000 units CAPS capsule Take 1 capsule (50,000 Units total) by mouth every 7 (seven) days.   No facility-administered encounter medications on file as of 07/13/2019.     Allergies: Patient has no known allergies.  Body mass index is 31.93 kg/m.  Blood pressure 131/73, pulse 68, temperature 99.5 F (37.5 C), temperature source Oral, height 6' (1.829 m), weight 235 lb 6.4 oz (106.8 kg), SpO2 99 %.  Review of Systems  Constitutional: Positive for fatigue. Negative for activity change, appetite change, chills, diaphoresis, fever and unexpected weight change.  HENT: Negative for congestion.   Eyes: Negative for visual disturbance.  Respiratory: Negative for cough, chest tightness, shortness of breath, wheezing and stridor.   Cardiovascular: Negative for chest pain, palpitations and leg swelling.  Gastrointestinal: Negative for abdominal distention, abdominal pain, blood in stool, constipation, diarrhea, nausea and vomiting.  Endocrine: Negative for polydipsia, polyphagia and polyuria.  Genitourinary: Negative for difficulty urinating and flank pain.  Musculoskeletal: Negative for arthralgias, back pain, gait problem, joint swelling, myalgias, neck pain and neck stiffness.  Skin: Negative for color change, pallor, rash and wound.  Neurological: Negative for dizziness and headaches.  Hematological: Negative for adenopathy. Does not bruise/bleed easily.  Psychiatric/Behavioral: Negative for agitation, behavioral problems, confusion, decreased concentration, dysphoric mood, hallucinations, self-injury, sleep disturbance and suicidal ideas. The patient is not nervous/anxious and is not hyperactive.        Objective:   Physical Exam Vitals signs and nursing note reviewed.  Constitutional:      General: He is not in acute distress.    Appearance: He is obese. He  is not ill-appearing, toxic-appearing or diaphoretic.  HENT:     Head: Normocephalic and atraumatic.     Right Ear: Tympanic membrane, ear canal and external ear normal. There is no impacted cerumen.     Left Ear: Tympanic membrane, ear canal and external ear normal. There is no impacted cerumen.     Nose: Nose normal. No congestion.     Mouth/Throat:     Mouth: Mucous membranes are moist.     Pharynx: No oropharyngeal exudate.  Eyes:     Extraocular Movements: Extraocular movements intact.     Conjunctiva/sclera: Conjunctivae normal.     Pupils: Pupils are equal, round, and reactive to light.  Neck:     Musculoskeletal: Normal range of motion and neck supple. No muscular tenderness.  Cardiovascular:     Rate and Rhythm: Normal rate and regular rhythm.     Pulses: Normal pulses.     Heart sounds: Normal heart sounds. No murmur. No friction rub. No gallop.   Pulmonary:     Effort: Pulmonary effort is normal. No respiratory distress.     Breath sounds: Normal breath sounds. No stridor. No wheezing, rhonchi or rales.  Chest:     Chest wall: No tenderness.  Abdominal:     General: Bowel sounds are normal.     Palpations: Abdomen is soft.  Tenderness: There is no abdominal tenderness. There is no right CVA tenderness, left CVA tenderness, guarding or rebound.  Musculoskeletal: Normal range of motion.        General: No tenderness.  Skin:    General: Skin is warm and dry.     Capillary Refill: Capillary refill takes less than 2 seconds.  Neurological:     Mental Status: He is alert and oriented to person, place, and time.     Coordination: Coordination normal.  Psychiatric:        Mood and Affect: Mood normal.        Behavior: Behavior normal.        Thought Content: Thought content normal.        Judgment: Judgment normal.        Assessment & Plan:   1. Healthcare maintenance   2. BMI 31.0-31.9,adult   3. Anxiety     Healthcare maintenance To help lower LDL (bad)  cholesterol- reduce saturated fat and increase regular exercise. Increase water intake- strive to drink at least half of your body weight in ounces per day. If your IBS symptoms return- recommend following up with Dr. Mathews Robinsons. Continue to social distance and wear a mask when in public. Recommend annual physical with fasting labs the week prior.  BMI 31.0-31.9,adult He denies any formal exercise. He has gained >20 lbs since last OV 8/13/209 Current wt 235 Body mass index is 31.93 kg/m.  Anxiety Resolved     FOLLOW-UP:  Return in about 1 year (around 07/12/2020) for CPE, Fasting Labs.

## 2019-07-13 ENCOUNTER — Ambulatory Visit (INDEPENDENT_AMBULATORY_CARE_PROVIDER_SITE_OTHER): Payer: Managed Care, Other (non HMO) | Admitting: Adult Health

## 2019-07-13 ENCOUNTER — Encounter: Payer: Self-pay | Admitting: Adult Health

## 2019-07-13 ENCOUNTER — Other Ambulatory Visit: Payer: Self-pay

## 2019-07-13 DIAGNOSIS — F419 Anxiety disorder, unspecified: Secondary | ICD-10-CM

## 2019-07-13 DIAGNOSIS — Z6831 Body mass index (BMI) 31.0-31.9, adult: Secondary | ICD-10-CM | POA: Diagnosis not present

## 2019-07-13 DIAGNOSIS — Z0000 Encounter for general adult medical examination without abnormal findings: Secondary | ICD-10-CM | POA: Diagnosis not present

## 2019-07-13 NOTE — Assessment & Plan Note (Signed)
He denies any formal exercise. He has gained >20 lbs since last OV 8/13/209 Current wt 235 Body mass index is 31.93 kg/m.

## 2019-07-13 NOTE — Assessment & Plan Note (Addendum)
To help lower LDL (bad) cholesterol- reduce saturated fat and increase regular exercise. Increase water intake- strive to drink at least half of your body weight in ounces per day. If your IBS symptoms return- recommend following up with Dr. Mathews Robinsons. Continue to social distance and wear a mask when in public. Recommend annual physical with fasting labs the week prior.

## 2019-07-13 NOTE — Patient Instructions (Addendum)

## 2019-07-13 NOTE — Assessment & Plan Note (Signed)
Resolved

## 2020-01-21 IMAGING — US US SOFT TISSUE HEAD/NECK
1 series · 14 of 25 positions shown · non-contrast
Comparison: None.

CLINICAL DATA: 32-year-old male with a ten-day history of
odynophagia

EXAM:
ULTRASOUND OF HEAD/NECK SOFT TISSUES
TECHNIQUE: Ultrasound examination of the head and neck soft tissues was
performed in the area of clinical concern.

[Series 1: us soft tissue head/neck · 0.04mm/px · 29 acquisitions, 14 frames shown]
[im 1/29]
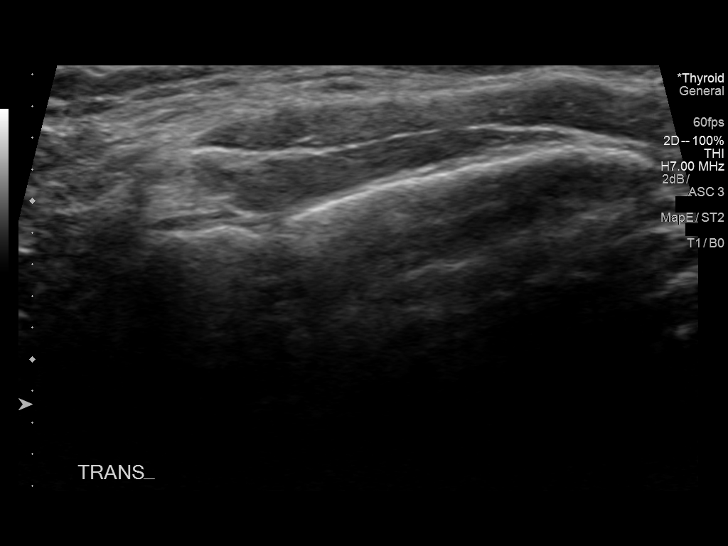
[im 3/29]
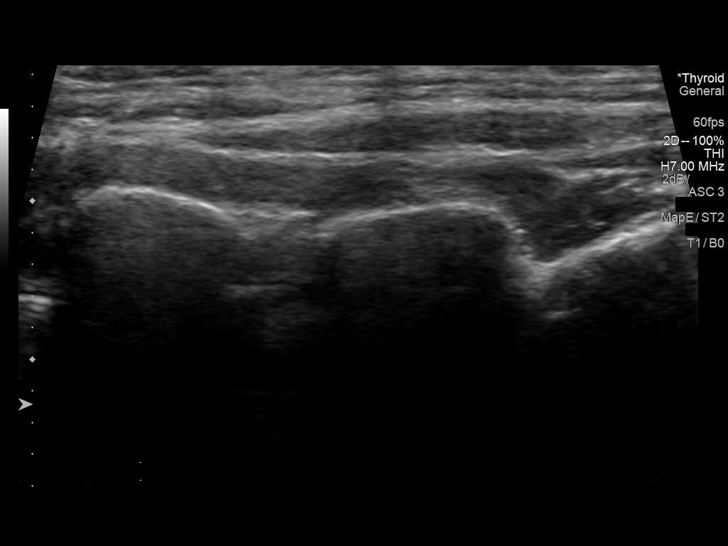
[im 5/29]
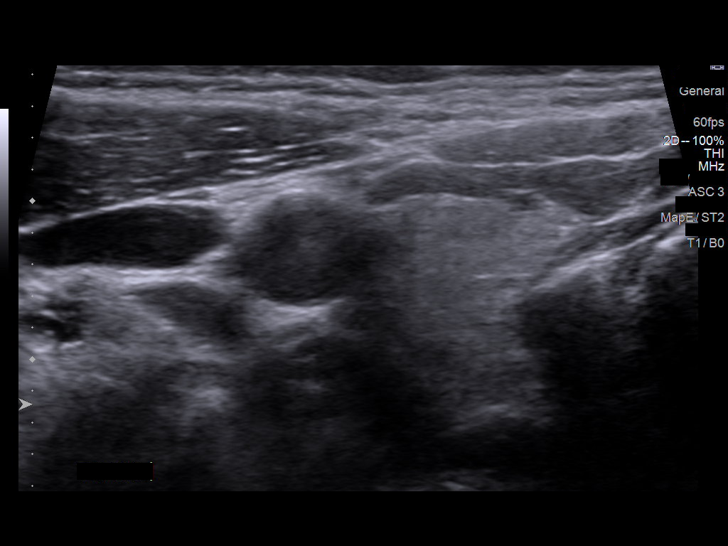
[im 8/29]
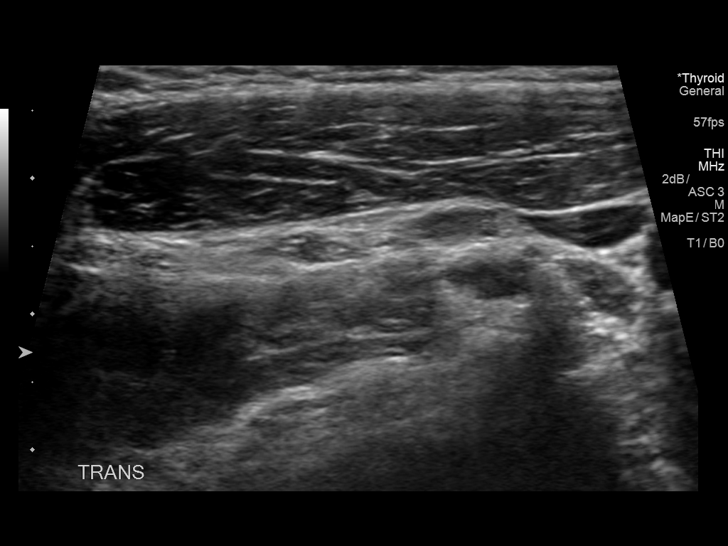
[im 10/29]
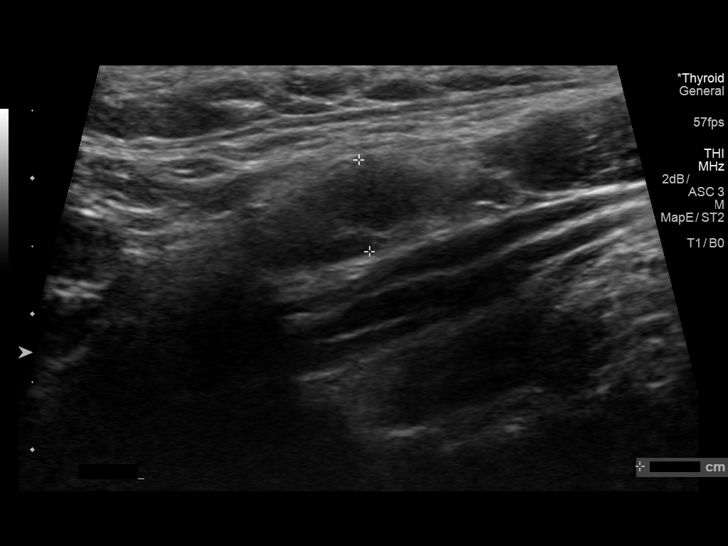
[im 11/29]
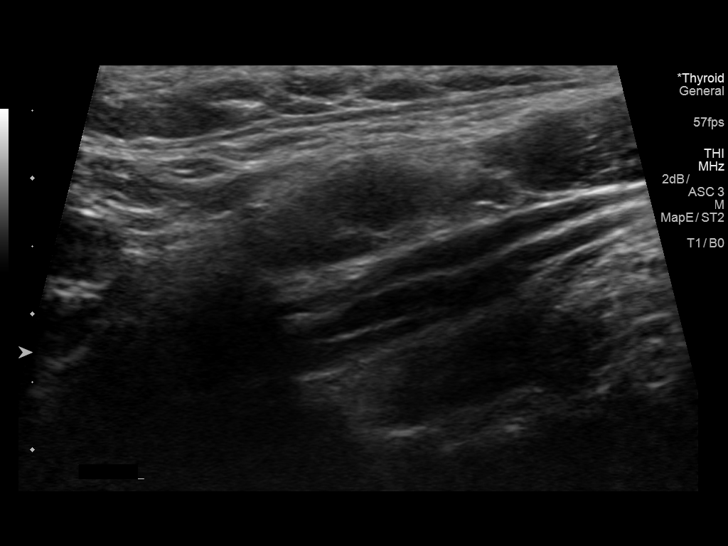
[im 13/29]
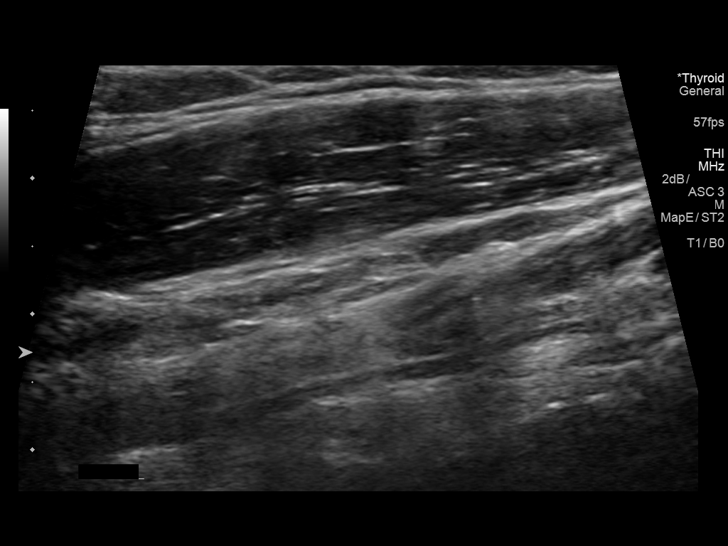
[im 16/29]
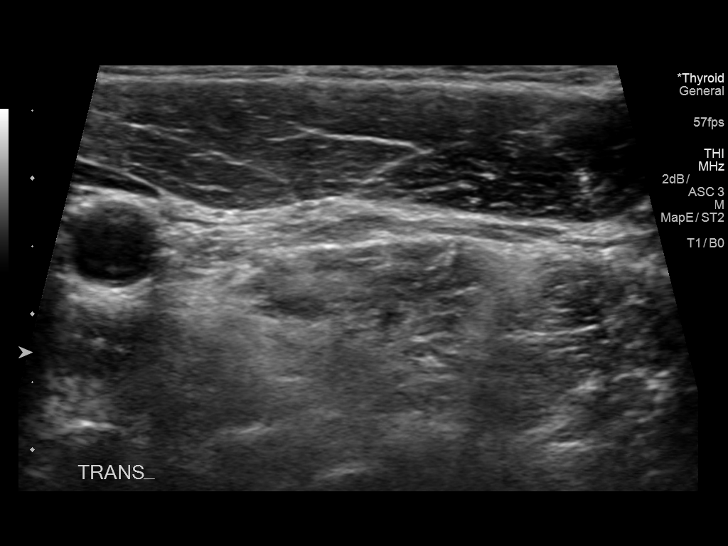
[im 18/29]
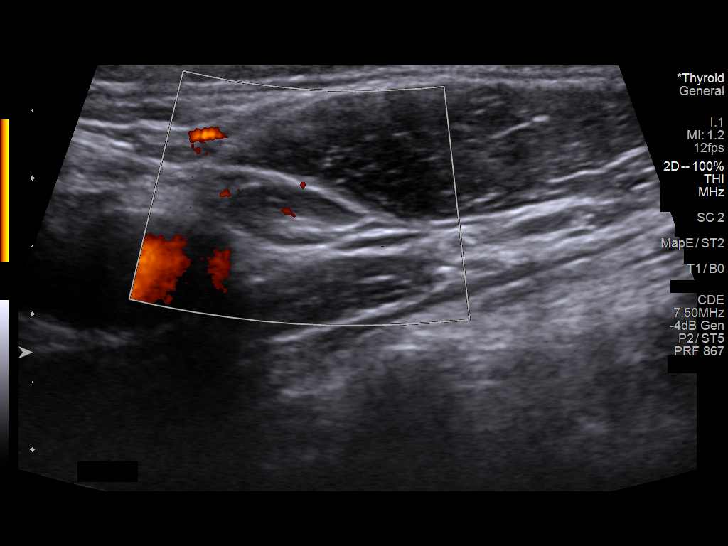
[im 19/29]
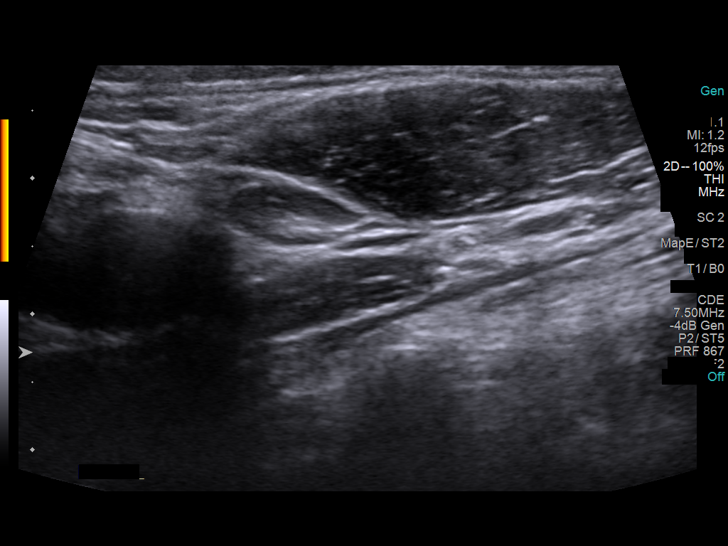
[im 22/29]
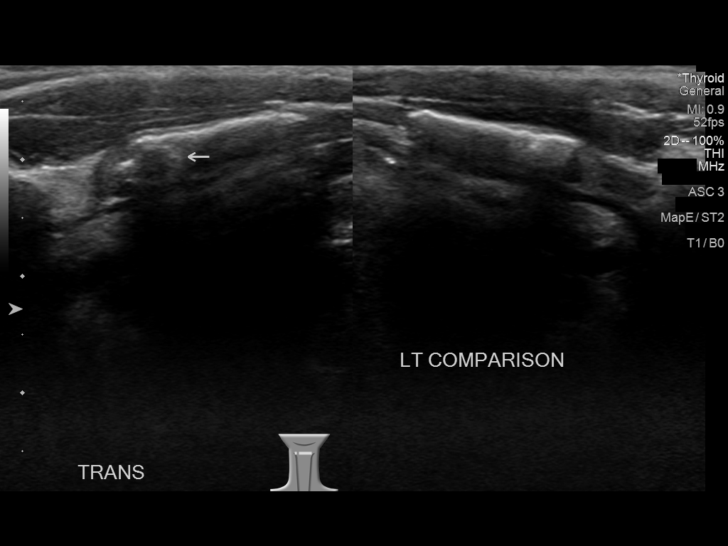
[im 24/29]
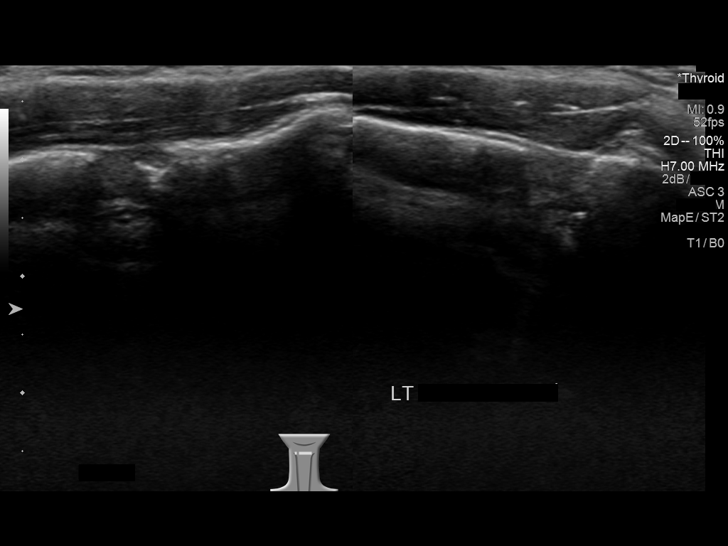
[im 26/29]
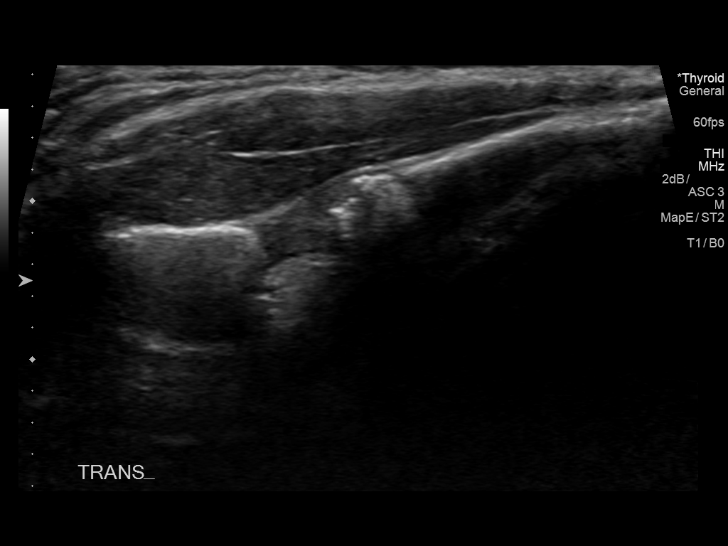
[im 29/29]
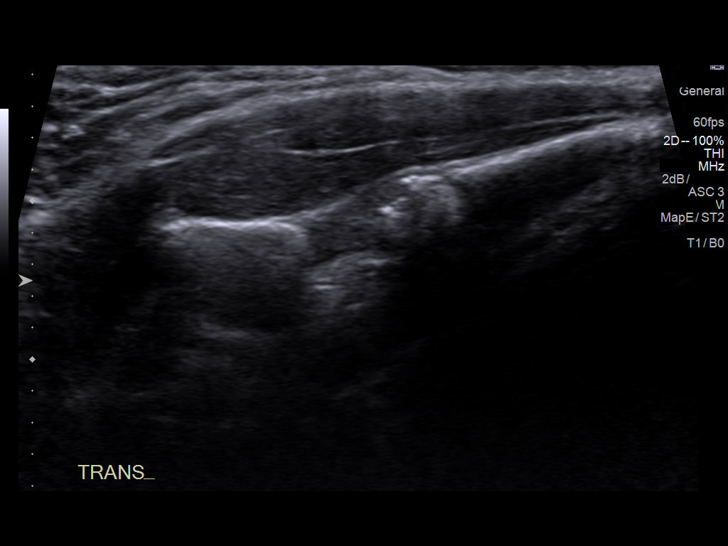

[14 of 25 positions shown; findings below may reference images not displayed]

FINDINGS: Sonographic interrogation of the soft tissues of the neck
demonstrates normal subcutaneous fat, sternocleidomastoid
musculature and the internal jugular vein and common carotid artery.
There are a few cervical chain lymph nodes which remain normal in
size and sonographic morphology.

There is some asymmetry of the hyoid bone with the right-side
appearing slightly different than the left. This is not well
evaluated by sonography.
IMPRESSION: 1. No evidence of mass, abscess or lymphadenopathy.
2. The hyoid bone appears to be in the vicinity of the patient's
clinical symptoms. There is slight asymmetry of the right aspect of
the hyoid bone relative to the left which is not well evaluated by
ultrasound. If clinical concern persists, recommend CT scan of the
neck with intravenous contrast.

## 2020-11-14 ENCOUNTER — Encounter: Payer: Self-pay | Admitting: Nurse Practitioner

## 2020-11-14 ENCOUNTER — Ambulatory Visit (INDEPENDENT_AMBULATORY_CARE_PROVIDER_SITE_OTHER): Payer: Managed Care, Other (non HMO) | Admitting: Nurse Practitioner

## 2020-11-14 ENCOUNTER — Other Ambulatory Visit: Payer: Self-pay

## 2020-11-14 VITALS — BP 121/85 | HR 68 | Temp 98.3°F | Ht 72.0 in | Wt 237.6 lb

## 2020-11-14 DIAGNOSIS — R413 Other amnesia: Secondary | ICD-10-CM | POA: Diagnosis not present

## 2020-11-14 DIAGNOSIS — M722 Plantar fascial fibromatosis: Secondary | ICD-10-CM | POA: Insufficient documentation

## 2020-11-14 DIAGNOSIS — Z0000 Encounter for general adult medical examination without abnormal findings: Secondary | ICD-10-CM | POA: Diagnosis not present

## 2020-11-14 NOTE — Progress Notes (Signed)
Established Patient Office Visit  Subjective:  Patient ID: Carlos Silva, male    DOB: 08-18-1986  Age: 35 y.o. MRN: [id number]  CC:  Chief Complaint  Patient presents with  . Annual Exam    HPI Carlos Silva presents for health maintenance exam. Today, he states that he has been having left foot pain for a few months. Pain is mostly in the arch of the foot. Started after the pair of shoes he loved wore out and he had to get new pair of shoes. Since then, the pain has been rather persistent. The pain is most severe after he has been sleeping and first gets up in the morning. It is also more severe during the day after he sits and rests for a little while and then stands back up. He states that he has tried a few different inserts into his shoes which have not helped that much. He has not had to take any medication to help with pain.  The patient does report having issues with "brain fog."  He states that this has been an issue since he had c.diff a few years back. He states that he has learned to live with it and work around problems with memory.  The patient denies other physical concerns or complaints today. He is due to have check of fasting blood work.   Past Medical History:  Diagnosis Date  . Abnormal C-reactive protein 11/17/2017  . C. difficile enteritis   . Clostridium difficile infection   . Odynophagia 02/02/2018  . Vitamin D deficiency     Past Surgical History:  Procedure Laterality Date  . COLONOSCOPY WITH PROPOFOL N/A 03/17/2018   Procedure: COLONOSCOPY WITH PROPOFOL;  Surgeon: Lucilla Lame, MD;  Location: [facility] ENDOSCOPY;  Service: Endoscopy;  Laterality: N/A;  . wisdopm tooth extrac      Family History  Problem Relation Age of Onset  . Congestive Heart Failure Mother   . Vision loss Maternal Grandfather   . Cancer Paternal Grandfather     Social History   Socioeconomic History  . Marital status: Married    Spouse name: Colletta [place]  . Number of children: 1  .  Years of education: Not on file  . Highest education level: Not on file  Occupational History  . Occupation: Presenter, broadcasting    Comment: [place]  Tobacco Use  . Smoking status: Never Smoker  . Smokeless tobacco: Never Used  Vaping Use  . Vaping Use: Never used  Substance and Sexual Activity  . Alcohol use: Yes    Comment: occasional. none last 24hrs  . Drug use: No  . Sexual activity: Yes    Partners: Female    Comment: wife  Other Topics Concern  . Not on file  Social History Narrative  . Not on file   Social Determinants of Health   Financial Resource Strain: Not on file  Food Insecurity: Not on file  Transportation Needs: Not on file  Physical Activity: Not on file  Stress: Not on file  Social Connections: Not on file  Intimate Partner Violence: Not on file    No outpatient medications prior to visit.   No facility-administered medications prior to visit.    No Known Allergies  ROS Review of Systems  Constitutional: Negative for activity change, chills and fever.  HENT: Negative for sinus pain.   Respiratory: Negative for cough and wheezing.   Cardiovascular: Negative for chest pain and palpitations.  Gastrointestinal: Negative for constipation, diarrhea, nausea and  vomiting.  Endocrine: Negative.   Genitourinary: Negative.   Musculoskeletal: Positive for arthralgias and myalgias. Negative for back pain.       Pain in the arch of the left foot. This is mostly after he has been sitting or resting for some time then stands up. Pain gradually gets better as the day goes on.   Skin: Negative for rash.  Allergic/Immunologic: Negative for environmental allergies.  Neurological: Negative for dizziness, weakness and headaches.  Psychiatric/Behavioral: The patient is not nervous/anxious.   All other systems reviewed and are negative.     Objective:    Physical Exam Vitals and nursing note reviewed.  Constitutional:      Appearance: Normal  appearance. He is well-developed.  HENT:     Head: Normocephalic and atraumatic.     Right Ear: Ear canal and external ear normal.     Left Ear: Ear canal and external ear normal.     Nose: Nose normal.     Mouth/Throat:     Mouth: Mucous membranes are moist.     Pharynx: Oropharynx is clear.  Eyes:     Extraocular Movements: Extraocular movements intact.     Conjunctiva/sclera: Conjunctivae normal.     Pupils: Pupils are equal, round, and reactive to light.  Neck:     Thyroid: No thyromegaly or thyroid tenderness.     Vascular: No carotid bruit.  Cardiovascular:     Rate and Rhythm: Normal rate and regular rhythm.     Pulses: Normal pulses.     Heart sounds: Normal heart sounds.  Pulmonary:     Effort: Pulmonary effort is normal.     Breath sounds: Normal breath sounds.  Abdominal:     General: Bowel sounds are normal.     Palpations: Abdomen is soft.     Tenderness: There is no abdominal tenderness.     Hernia: There is no hernia in the left inguinal area or right inguinal area.  Musculoskeletal:        General: Normal range of motion.     Cervical back: Normal range of motion and neck supple. No tenderness. Normal range of motion.  Lymphadenopathy:     Cervical: No cervical adenopathy.     Lower Body: No right inguinal adenopathy. No left inguinal adenopathy.  Skin:    General: Skin is warm and dry.  Neurological:     General: No focal deficit present.     Mental Status: He is alert and oriented to person, place, and time.     Cranial Nerves: No cranial nerve deficit.  Psychiatric:        Mood and Affect: Mood normal.        Behavior: Behavior normal.        Thought Content: Thought content normal.        Judgment: Judgment normal.     Today's Vitals   11/14/20 0911  BP: 121/85  Pulse: 68  Temp: 98.3 F (36.8 C)  SpO2: 99%  Weight: 237 lb 9.6 oz (107.8 kg)  Height: 6' (1.829 m)   Body mass index is 32.22 kg/m.    Wt Readings from Last 3 Encounters:   11/14/20 237 lb 9.6 oz (107.8 kg)  07/13/19 235 lb 6.4 oz (106.8 kg)  04/14/18 214 lb 12.8 oz (97.4 kg)     There are no preventive care reminders to display for this patient.  There are no preventive care reminders to display for this patient.  Lab Results  Component Value Date  TSH 0.706 07/06/2019   Lab Results  Component Value Date   WBC 5.8 07/06/2019   HGB 15.5 07/06/2019   HCT 44.8 07/06/2019   MCV 88 07/06/2019   PLT 269 07/06/2019   Lab Results  Component Value Date   NA 138 07/06/2019   K 5.1 07/06/2019   CO2 23 07/06/2019   GLUCOSE 100 (H) 07/06/2019   BUN 13 07/06/2019   CREATININE 0.95 07/06/2019   BILITOT 0.5 07/06/2019   ALKPHOS 56 07/06/2019   AST 21 07/06/2019   ALT 19 07/06/2019   PROT 6.8 07/06/2019   ALBUMIN 4.6 07/06/2019   CALCIUM 9.1 07/06/2019   Lab Results  Component Value Date   CHOL 192 07/06/2019   Lab Results  Component Value Date   HDL 51 07/06/2019   Lab Results  Component Value Date   LDLCALC 128 (H) 07/06/2019   Lab Results  Component Value Date   TRIG 70 07/06/2019   Lab Results  Component Value Date   CHOLHDL 3.8 07/06/2019   Lab Results  Component Value Date   HGBA1C 5.6 07/06/2019      Assessment & Plan:  1. Healthcare maintenance Annual health maintenance exam today. Will check fasting blood work while in the office today.  - CBC with Differential/Platelet - Comprehensive metabolic panel - Lipid panel - TSH - HgB A1c  2. Plantar fasciitis of left foot Written information provided explaining exercises he can do at home to help this condition. Advised patient if there is no improvement or worsening of symptoms, he should notify office. He may need referral to podiatry for further evaluation and treatment.   3. Memory deficit Ongoing problem since having c.diff a few years ago. Patient has learned to accommodate deficit and work around and with it. Will continue to monitor.    Problem List Items  Addressed This Visit      Musculoskeletal and Integument   Plantar fasciitis of left foot     Other   Healthcare maintenance - Primary   Relevant Orders   CBC with Differential/Platelet   Comprehensive metabolic panel   Lipid panel   TSH   HgB A1c   Memory deficit     Time spent with the patient was approximately 40 minutes. This time included reviewing progress notes, labs, imaging studies, and discussing plan for follow up.   Follow-up: Return in about 1 year (around 11/14/2021) for health maintenance exam.    Ronnell Freshwater, NP

## 2020-11-14 NOTE — Patient Instructions (Signed)
Plantar Fasciitis Rehab Ask your health care provider which exercises are safe for you. Do exercises exactly as told by your health care provider and adjust them as directed. It is normal to feel mild stretching, pulling, tightness, or discomfort as you do these exercises. Stop right away if you feel sudden pain or your pain gets worse. Do not begin these exercises until told by your health care provider. Stretching and range-of-motion exercises These exercises warm up your muscles and joints and improve the movement and flexibility of your foot. These exercises also help to relieve pain. Plantar fascia stretch 1. Sit with your left / right leg crossed over your opposite knee. 2. Hold your heel with one hand with that thumb near your arch. With your other hand, hold your toes and gently pull them back toward the top of your foot. You should feel a stretch on the base (bottom) of your toes, or the bottom of your foot (plantar fascia), or both. 3. Hold this stretch for__________ seconds. 4. Slowly release your toes and return to the starting position. Repeat __________ times. Complete this exercise __________ times a day.   Gastrocnemius stretch, standing This exercise is also called a calf (gastroc) stretch. It stretches the muscles in the back of the upper calf. 1. Stand with your hands against a wall. 2. Extend your left / right leg behind you, and bend your front knee slightly. 3. Keeping your heels on the floor, your toes facing forward, and your back knee straight, shift your weight toward the wall. Do not arch your back. You should feel a gentle stretch in your upper calf. 4. Hold this position for __________ seconds. Repeat __________ times. Complete this exercise __________ times a day.   Soleus stretch, standing This exercise is also called a calf (soleus) stretch. It stretches the muscles in the back of the lower calf. 1. Stand with your hands against a wall. 2. Extend your left / right  leg behind you, and bend your front knee slightly. 3. Keeping your heels on the floor and your toes facing forward, bend your back knee and shift your weight slightly over your back leg. You should feel a gentle stretch deep in your lower calf. 4. Hold this position for __________ seconds. Repeat __________ times. Complete this exercise __________ times a day. Gastroc and soleus stretch, standing step This exercise stretches the muscles in the back of the lower leg. These muscles are in the upper calf (gastrocnemius) and the lower calf (soleus). 1. Stand with the ball of your left / right foot on the front of a step. The ball of your foot is on the walking surface, right under your toes. 2. Keep your other foot firmly on the same step. 3. Hold on to the wall or a railing for balance. 4. Slowly lift your other foot, allowing your body weight to press your heel down over the edge of the front of the step. Keep knee straight and unbent. You should feel a stretch in your calf. 5. Hold this position for __________ seconds. 6. Return both feet to the step. 7. Repeat this exercise with a slight bend in your left / right knee. Repeat __________ times with your left / right knee straight and __________ times with your left / right knee bent. Complete this exercise __________ times a day. Balance exercise This exercise builds your balance and strength control of your arch to help take pressure off your plantar fascia. Single leg stand If this exercise   is too easy, you can try it with your eyes closed or while standing on a pillow. 1. Without shoes, stand near a railing or in a doorway. You may hold on to the railing or door frame as needed. 2. Stand on your left / right foot. Keep your big toe down on the floor and lift the arch of your foot. You should feel a stretch across the bottom of your foot and your arch. Do not let your foot roll inward. 3. Hold this position for __________ seconds. Repeat  __________ times. Complete this exercise __________ times a day. This information is not intended to replace advice given to you by your health care provider. Make sure you discuss any questions you have with your health care provider. Document Revised: 06/01/2020 Document Reviewed: 06/01/2020 Elsevier Patient Education  2021 [employer] Inc.  

## 2020-11-15 LAB — CBC WITH DIFFERENTIAL/PLATELET
Basophils Absolute: 0.1 10*3/uL (ref 0.0–0.2)
Basos: 1 %
EOS (ABSOLUTE): 0.3 10*3/uL (ref 0.0–0.4)
Eos: 5 %
Hematocrit: 45.2 % (ref 37.5–51.0)
Hemoglobin: 15.2 g/dL (ref 13.0–17.7)
Immature Grans (Abs): 0.1 10*3/uL (ref 0.0–0.1)
Immature Granulocytes: 1 %
Lymphocytes Absolute: 1.8 10*3/uL (ref 0.7–3.1)
Lymphs: 30 %
MCH: 29.5 pg (ref 26.6–33.0)
MCHC: 33.6 g/dL (ref 31.5–35.7)
MCV: 88 fL (ref 79–97)
Monocytes Absolute: 0.6 10*3/uL (ref 0.1–0.9)
Monocytes: 9 %
Neutrophils Absolute: 3.2 10*3/uL (ref 1.4–7.0)
Neutrophils: 54 %
Platelets: 265 10*3/uL (ref 150–450)
RBC: 5.15 x10E6/uL (ref 4.14–5.80)
RDW: 12.7 % (ref 11.6–15.4)
WBC: 5.9 10*3/uL (ref 3.4–10.8)

## 2020-11-15 LAB — TSH: TSH: 0.724 u[IU]/mL (ref 0.450–4.500)

## 2020-11-15 LAB — HEMOGLOBIN A1C
Est. average glucose Bld gHb Est-mCnc: 117 mg/dL
Hgb A1c MFr Bld: 5.7 % — ABNORMAL HIGH (ref 4.8–5.6)

## 2020-11-15 LAB — COMPREHENSIVE METABOLIC PANEL
ALT: 15 IU/L (ref 0–44)
AST: 15 IU/L (ref 0–40)
Albumin/Globulin Ratio: 2.7 — ABNORMAL HIGH (ref 1.2–2.2)
Albumin: 4.9 g/dL (ref 4.0–5.0)
Alkaline Phosphatase: 57 IU/L (ref 44–121)
BUN/Creatinine Ratio: 14 (ref 9–20)
BUN: 14 mg/dL (ref 6–20)
Bilirubin Total: 0.4 mg/dL (ref 0.0–1.2)
CO2: 23 mmol/L (ref 20–29)
Calcium: 9.1 mg/dL (ref 8.7–10.2)
Chloride: 104 mmol/L (ref 96–106)
Creatinine, Ser: 1.02 mg/dL (ref 0.76–1.27)
Globulin, Total: 1.8 g/dL (ref 1.5–4.5)
Glucose: 102 mg/dL — ABNORMAL HIGH (ref 65–99)
Potassium: 4.8 mmol/L (ref 3.5–5.2)
Sodium: 141 mmol/L (ref 134–144)
Total Protein: 6.7 g/dL (ref 6.0–8.5)
eGFR: 98 mL/min/{1.73_m2} (ref >59)

## 2020-11-15 LAB — LIPID PANEL
Chol/HDL Ratio: 3.8 ratio (ref 0.0–5.0)
Cholesterol, Total: 188 mg/dL (ref 100–199)
HDL: 49 mg/dL (ref >39)
LDL Chol Calc (NIH): 125 mg/dL — ABNORMAL HIGH (ref 0–99)
Triglycerides: 75 mg/dL (ref 0–149)
VLDL Cholesterol Cal: 14 mg/dL (ref 5–40)

## 2020-11-15 NOTE — Progress Notes (Signed)
Thus far, labs looking ok. Waiting for all results.

## 2020-11-15 NOTE — Progress Notes (Signed)
Please let the patient know that labs look good, overall. His bad cholesterol was a little elevated, however, the remainder of cholesterol panel was good. He should limit fried and fatty foods in the diet and exercise regularly. All other labs were good and we will recheck them next year. Thanks.

## 2021-11-16 ENCOUNTER — Encounter: Payer: Managed Care, Other (non HMO) | Admitting: Nurse Practitioner

## 2021-11-20 ENCOUNTER — Ambulatory Visit (INDEPENDENT_AMBULATORY_CARE_PROVIDER_SITE_OTHER): Payer: Managed Care, Other (non HMO) | Admitting: Nurse Practitioner

## 2021-11-20 ENCOUNTER — Encounter: Payer: Self-pay | Admitting: Nurse Practitioner

## 2021-11-20 ENCOUNTER — Other Ambulatory Visit: Payer: Self-pay

## 2021-11-20 VITALS — BP 116/73 | HR 68 | Ht 72.0 in | Wt 228.6 lb

## 2021-11-20 DIAGNOSIS — Z6831 Body mass index (BMI) 31.0-31.9, adult: Secondary | ICD-10-CM | POA: Diagnosis not present

## 2021-11-20 DIAGNOSIS — Z1329 Encounter for screening for other suspected endocrine disorder: Secondary | ICD-10-CM

## 2021-11-20 DIAGNOSIS — Z130 Encounter for screening for diseases of the blood and blood-forming organs and certain disorders involving the immune mechanism: Secondary | ICD-10-CM

## 2021-11-20 DIAGNOSIS — D485 Neoplasm of uncertain behavior of skin: Secondary | ICD-10-CM | POA: Diagnosis not present

## 2021-11-20 DIAGNOSIS — Z0001 Encounter for general adult medical examination with abnormal findings: Secondary | ICD-10-CM

## 2021-11-20 DIAGNOSIS — Z1321 Encounter for screening for nutritional disorder: Secondary | ICD-10-CM

## 2021-11-20 DIAGNOSIS — Z0000 Encounter for general adult medical examination without abnormal findings: Secondary | ICD-10-CM

## 2021-11-20 DIAGNOSIS — M545 Low back pain, unspecified: Secondary | ICD-10-CM | POA: Insufficient documentation

## 2021-11-20 DIAGNOSIS — Z13228 Encounter for screening for other metabolic disorders: Secondary | ICD-10-CM

## 2021-11-20 NOTE — Progress Notes (Signed)
Established patient visit ? ? ?Patient: Carlos Silva   DOB: May 10, 1986   36 y.o. Male  MRN: [id number] ?Visit Date: 11/20/2021 ? ? ?Chief Complaint  ?Patient presents with  ? Annual Exam  ? ?Subjective  ?  ?HPI  ?The patient is here for annual wellness visit.  ?-due to have routine, fasting labs. Patient is fasting today.  ?-dealing with some back pain related to work injury. Going through [employer] through his employer.  ?-intermittent palpitations. Started after he used lidocaine patch along with meloxicam.  ?-Few, small, dark, irregularly shaped lesions on back which need further evaluation. - were initially seen during PT appointment, therapist recommending he have these evaluated.  ?denies chest pain, chest pressure, or shortness of breath. He denies headaches or visual disturbances. He denies abdominal pain, nausea, vomiting, or changes in bowel or bladder habits.   ? ? ?Medications: ?Outpatient Medications Prior to Visit  ?Medication Sig  ? meloxicam (MOBIC) 7.5 MG tablet Take 15 mg by mouth daily.  ? ?No facility-administered medications prior to visit.  ? ? ?Review of Systems  ?Constitutional:  Negative for activity change, chills, fatigue and fever.  ?HENT:  Negative for congestion, postnasal drip, rhinorrhea, sinus pressure, sinus pain, sneezing and sore throat.   ?Eyes: Negative.   ?Respiratory:  Negative for cough, shortness of breath and wheezing.   ?Cardiovascular:  Positive for palpitations. Negative for chest pain.  ?     Intermittent. Only happened when using lidocaine patch along with meloxicam   ?Gastrointestinal:  Negative for constipation, diarrhea, nausea and vomiting.  ?Endocrine: Negative for cold intolerance, heat intolerance, polydipsia and polyuria.  ?Genitourinary:  Negative for dysuria, frequency and urgency.  ?Musculoskeletal:  Positive for arthralgias and back pain. Negative for myalgias.  ?Skin:  Negative for rash.  ?Allergic/Immunologic: Negative for environmental allergies.   ?Neurological:  Negative for dizziness, weakness and headaches.  ?Psychiatric/Behavioral:  The patient is not nervous/anxious.   ? ? ? ? Objective  ?  ? ?Today's Vitals  ? 11/20/21 1325  ?BP: 116/73  ?Pulse: 68  ?SpO2: 100%  ?Weight: 228 lb 9.6 oz (103.7 kg)  ?Height: 6' (1.829 m)  ? ?Body mass index is 31 kg/m?.  ? ?BP Readings from Last 3 Encounters:  ?11/20/21 116/73  ?11/14/20 121/85  ?07/13/19 131/73  ?  ?Wt Readings from Last 3 Encounters:  ?11/20/21 228 lb 9.6 oz (103.7 kg)  ?11/14/20 237 lb 9.6 oz (107.8 kg)  ?07/13/19 235 lb 6.4 oz (106.8 kg)  ?  ?Physical Exam ?Vitals and nursing note reviewed.  ?Constitutional:   ?   Appearance: Normal appearance. He is well-developed.  ?HENT:  ?   Head: Normocephalic and atraumatic.  ?   Right Ear: Tympanic membrane, ear canal and external ear normal.  ?   Left Ear: Tympanic membrane, ear canal and external ear normal.  ?   Nose: Nose normal.  ?   Mouth/Throat:  ?   Mouth: Mucous membranes are moist.  ?   Pharynx: Oropharynx is clear.  ?Eyes:  ?   Extraocular Movements: Extraocular movements intact.  ?   Conjunctiva/sclera: Conjunctivae normal.  ?   Pupils: Pupils are equal, round, and reactive to light.  ?Cardiovascular:  ?   Rate and Rhythm: Normal rate and regular rhythm.  ?   Pulses: Normal pulses.  ?   Heart sounds: Normal heart sounds.  ?Pulmonary:  ?   Effort: Pulmonary effort is normal.  ?   Breath sounds: Normal breath sounds.  ?Abdominal:  ?  General: Bowel sounds are normal. There is no distension.  ?   Palpations: Abdomen is soft. There is no mass.  ?   Tenderness: There is no abdominal tenderness. There is no guarding or rebound.  ?   Hernia: No hernia is present.  ?Musculoskeletal:     ?   General: Normal range of motion.  ?   Cervical back: Normal range of motion and neck supple.  ?Lymphadenopathy:  ?   Cervical: No cervical adenopathy.  ?Skin: ?   General: Skin is warm and dry.  ?   Capillary Refill: Capillary refill takes less than 2 seconds.  ? ?    ?    Comments: Lesion #1 - center of his back. Measures about 57m in diameter. Center is dark brown. There is surrounding area which is light red/pink, and irregularly shaped.  ?Lesion #2 - right lower flank area - irregularly shaped, dark brown lesion. It is 359min width and 42m69mn height   ?Neurological:  ?   General: No focal deficit present.  ?   Mental Status: He is alert and oriented to person, place, and time.  ?Psychiatric:     ?   Mood and Affect: Mood normal.     ?   Behavior: Behavior normal.     ?   Thought Content: Thought content normal.     ?   Judgment: Judgment normal.  ?  ? ? ? Assessment & Plan  ?  ? ?1. Encounter for general adult medical examination with abnormal findings ?Annual wellness visit today. Routine, fasting labs drawn during today's visit.  ? ?2. Acute midline low back pain without sciatica ?Patient with intermittent low back pain due to work injury.  Currently taking meloxicam as needed for pain with patient.  Reviewed information regarding stretches and rehab that he can do at home to improve pain. ? ?3. 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  Refer to dermatology for further evaluation. ?- Ambulatory referral to Dermatology ? ?4. Body mass index (BMI) of 31.0-31.9 in adult ?Encourage patient to limit calorie intake to 2000 cal/day or less.  He should consume a low cholesterol, low-fat diet.  Recommend he incorporate exercise into his daily routine.  ? ?5. Screening for endocrine, nutritional, metabolic and immunity disorder ?Routine, fasting labs drawn during today's visit.  ?- Hemoglobin A1c; Future ?- Lipid panel; Future ?- TSH; Future ?- Comp Met (CMET); Future ?- CBC; Future ?- Comp Met (CMET) ?- TSH ?- Lipid panel ?- Hemoglobin A1c ?- CBC ? ?6. Healthcare maintenance ?Routine, fasting labs drawn during today's visit.  ?- Hemoglobin A1c; Future ?- Lipid panel; Future ?- TSH; Future ?- Comp Met (CMET); Future ?- CBC; Future ?-  Comp Met (CMET) ?- TSH ?- Lipid panel ?- Hemoglobin A1c ?- CBC  ? ? ?Problem List Items Addressed This Visit   ? ?  ? Musculoskeletal and Integument  ? 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 Relevant Orders  ? Ambulatory referral to Dermatology  ?  ? Other  ? Healthcare maintenance  ? Relevant Orders  ? Hemoglobin A1c (Completed)  ? Lipid panel (Completed)  ? TSH (Completed)  ? Comp Met (CMET) (Completed)  ? CBC (Completed)  ? Body mass index (BMI) of 31.0-31.9 in adult  ? Acute midline low back pain without sciatica - Primary  ? Relevant Medications  ? meloxicam (MOBIC) 7.5 MG tablet  ? ?Other Visit  Diagnoses   ? ? Screening for endocrine, nutritional, metabolic and immunity disorder      ? Relevant Orders  ? Hemoglobin A1c (Completed)  ? Lipid panel (Completed)  ? TSH (Completed)  ? Comp Met (CMET) (Completed)  ? CBC (Completed)  ? Encounter for general adult medical examination with abnormal findings      ? ?  ?  ? ?Return in about 1 year (around 11/21/2022) for health maintenance exam, FBW at time of visit.  ?   ? ? ? ? ?Ronnell Freshwater, NP  ?[facility] ?[phone] (phone) ?[phone] (fax) ? ?[facility]  ?

## 2021-11-21 ENCOUNTER — Encounter: Payer: Self-pay | Admitting: Nurse Practitioner

## 2021-11-21 LAB — COMPREHENSIVE METABOLIC PANEL
ALT: 18 IU/L (ref 0–44)
AST: 17 IU/L (ref 0–40)
Albumin/Globulin Ratio: 2.4 — ABNORMAL HIGH (ref 1.2–2.2)
Albumin: 5 g/dL (ref 4.0–5.0)
Alkaline Phosphatase: 60 IU/L (ref 44–121)
BUN/Creatinine Ratio: 15 (ref 9–20)
BUN: 15 mg/dL (ref 6–20)
Bilirubin Total: 0.6 mg/dL (ref 0.0–1.2)
CO2: 21 mmol/L (ref 20–29)
Calcium: 9.5 mg/dL (ref 8.7–10.2)
Chloride: 103 mmol/L (ref 96–106)
Creatinine, Ser: 1.01 mg/dL (ref 0.76–1.27)
Globulin, Total: 2.1 g/dL (ref 1.5–4.5)
Glucose: 89 mg/dL (ref 70–99)
Potassium: 4.8 mmol/L (ref 3.5–5.2)
Sodium: 140 mmol/L (ref 134–144)
Total Protein: 7.1 g/dL (ref 6.0–8.5)
eGFR: 99 mL/min/{1.73_m2} (ref >59)

## 2021-11-21 LAB — CBC
Hematocrit: 45 % (ref 37.5–51.0)
Hemoglobin: 15.3 g/dL (ref 13.0–17.7)
MCH: 29.8 pg (ref 26.6–33.0)
MCHC: 34 g/dL (ref 31.5–35.7)
MCV: 88 fL (ref 79–97)
Platelets: 259 10*3/uL (ref 150–450)
RBC: 5.13 x10E6/uL (ref 4.14–5.80)
RDW: 12.5 % (ref 11.6–15.4)
WBC: 6 10*3/uL (ref 3.4–10.8)

## 2021-11-21 LAB — LIPID PANEL
Chol/HDL Ratio: 3.8 ratio (ref 0.0–5.0)
Cholesterol, Total: 203 mg/dL — ABNORMAL HIGH (ref 100–199)
HDL: 53 mg/dL (ref >39)
LDL Chol Calc (NIH): 140 mg/dL — ABNORMAL HIGH (ref 0–99)
Triglycerides: 56 mg/dL (ref 0–149)
VLDL Cholesterol Cal: 10 mg/dL (ref 5–40)

## 2021-11-21 LAB — HEMOGLOBIN A1C
Est. average glucose Bld gHb Est-mCnc: 111 mg/dL
Hgb A1c MFr Bld: 5.5 % (ref 4.8–5.6)

## 2021-11-21 LAB — TSH: TSH: 0.795 u[IU]/mL (ref 0.450–4.500)

## 2022-11-21 NOTE — Progress Notes (Signed)
Complete physical exam   Patient: Carlos Silva   DOB: 11-Dec-1985   37 y.o. Male  MRN: [id number] Visit Date: 11/22/2022    Chief Complaint  Patient presents with   Annual Exam   Subjective    Carlos Silva is a 37 y.o. male who presents today for a complete physical exam.  He reports consuming a  generally healthy  diet. Cooks some at home, eats out sometimes. The patient has a physically strenuous job, but has no regular exercise apart from work.  He generally feels well. He does not have additional problems to discuss today.   HPI  Annual physical  -hx mildly elevated lipid panel  -due four routine, fasting albs today.  -does have some low back pan which is residual from an injury he had at work about a year ago.  -did physical therapy for several months.  Stopped when he wasn't seeing too many results.  .He denies chest pain, chest pressure, or shortness of breath. He denies headaches or visual disturbances. He denies abdominal pain, nausea, vomiting, or changes in bowel or bladder habits.    Past Medical History:  Diagnosis Date   Abnormal C-reactive protein 11/17/2017   C. difficile enteritis    Clostridium difficile infection    Odynophagia 02/02/2018   Vitamin D deficiency    Past Surgical History:  Procedure Laterality Date   COLONOSCOPY WITH PROPOFOL N/A 03/17/2018   Procedure: COLONOSCOPY WITH PROPOFOL;  Surgeon: Lucilla Lame, MD;  Location: [facility] ENDOSCOPY;  Service: Endoscopy;  Laterality: N/A;   wisdopm tooth extrac     Social History   Socioeconomic History   Marital status: Married    Spouse name: Colletta [place]   Number of children: 1   Years of education: Not on file   Highest education level: Not on file  Occupational History   Occupation: Presenter, broadcasting    Comment: [employer]  Tobacco Use   Smoking status: Never   Smokeless tobacco: Never  Vaping Use   Vaping Use: Never used  Substance and Sexual Activity   Alcohol use: Yes    Comment:  occasional. none last 24hrs   Drug use: No   Sexual activity: Yes    Partners: Female    Comment: wife  Other Topics Concern   Not on file  Social History Narrative   Not on file   Social Determinants of Health   Financial Resource Strain: Not on file  Food Insecurity: Not on file  Transportation Needs: Not on file  Physical Activity: Not on file  Stress: Not on file  Social Connections: Not on file  Intimate Partner Violence: Not on file   Family Status  Relation Name Status   Mother  Alive   MGF  (Not Specified)   PGF  Deceased   Family History  Problem Relation Age of Onset   Congestive Heart Failure Mother    Vision loss Maternal Grandfather    Cancer Paternal Grandfather    No Known Allergies  Patient Care Team: Ronnell Freshwater, NP as PCP - General (Family Medicine) Marius Ditch, Tally Due, MD as Consulting Physician (Gastroenterology)   Medications: Outpatient Medications Prior to Visit  Medication Sig   meloxicam (MOBIC) 7.5 MG tablet Take 15 mg by mouth daily.   No facility-administered medications prior to visit.    Review of Systems  See HPI     Objective     Today's Vitals   11/22/22 0831  BP: 132/62  Pulse: 66  SpO2:  100%  Weight: 236 lb (107 kg)  Height: 6' (1.829 m)   Body mass index is 32.01 kg/m.  BP Readings from Last 3 Encounters:  11/22/22 132/62  11/20/21 116/73  11/14/20 121/85    Wt Readings from Last 3 Encounters:  11/22/22 236 lb (107 kg)  11/20/21 228 lb 9.6 oz (103.7 kg)  11/14/20 237 lb 9.6 oz (107.8 kg)     Physical Exam Vitals and nursing note reviewed.  Constitutional:      Appearance: Normal appearance. He is well-developed.  HENT:     Head: Normocephalic and atraumatic.     Right Ear: Tympanic membrane, ear canal and external ear normal.     Left Ear: Tympanic membrane, ear canal and external ear normal.     Nose: Nose normal.     Mouth/Throat:     Mouth: Mucous membranes are moist.     Pharynx:  Oropharynx is clear.  Eyes:     Extraocular Movements: Extraocular movements intact.     Conjunctiva/sclera: Conjunctivae normal.     Pupils: Pupils are equal, round, and reactive to light.  Neck:     Vascular: No carotid bruit.  Cardiovascular:     Rate and Rhythm: Normal rate and regular rhythm.     Pulses: Normal pulses.     Heart sounds: Normal heart sounds.  Pulmonary:     Effort: Pulmonary effort is normal.     Breath sounds: Normal breath sounds.  Abdominal:     Palpations: Abdomen is soft.  Musculoskeletal:        General: Normal range of motion.     Cervical back: Normal range of motion and neck supple.  Lymphadenopathy:     Cervical: No cervical adenopathy.  Skin:    General: Skin is warm and dry.     Capillary Refill: Capillary refill takes less than 2 seconds.  Neurological:     General: No focal deficit present.     Mental Status: He is alert and oriented to person, place, and time.  Psychiatric:        Mood and Affect: Mood normal.        Behavior: Behavior normal.        Thought Content: Thought content normal.        Judgment: Judgment normal.     Last depression screening scores   Row Labels 11/22/2022    8:34 AM 11/20/2021    1:30 PM 11/14/2020    9:12 AM  PHQ 2/9 Scores   Section Header. No data exists in this row.     PHQ - 2 Score   2 0 0  PHQ- 9 Score   3 0 0   Last fall risk screening   Row Labels 11/22/2022    8:33 AM  Fall Risk    Section Header. No data exists in this row.   Falls in the past year?   0  Number falls in past yr:   0  Injury with Fall?   0  Follow up   Falls evaluation completed    Results for orders placed or performed in visit on 11/22/22  Hemoglobin A1c  Result Value Ref Range   Hgb A1c MFr Bld 5.5 4.8 - 5.6 %   Est. average glucose Bld gHb Est-mCnc 111 mg/dL  CBC  Result Value Ref Range   WBC 5.4 3.4 - 10.8 x10E3/uL   RBC 5.09 4.14 - 5.80 x10E6/uL   Hemoglobin 15.1 13.0 - 17.7 g/dL   Hematocrit  44.4 37.5 - 51.0  %   MCV 87 79 - 97 fL   MCH 29.7 26.6 - 33.0 pg   MCHC 34.0 31.5 - 35.7 g/dL   RDW 16.1 09.6 - 04.5 %   Platelets 279 150 - 450 x10E3/uL  Comprehensive metabolic panel  Result Value Ref Range   Glucose 98 70 - 99 mg/dL   BUN 19 6 - 20 mg/dL   Creatinine, Ser 4.09 0.76 - 1.27 mg/dL   eGFR 96 >81 XB/JYN/8.29   BUN/Creatinine Ratio 18 9 - 20   Sodium 141 134 - 144 mmol/L   Potassium 5.4 (H) 3.5 - 5.2 mmol/L   Chloride 104 96 - 106 mmol/L   CO2 21 20 - 29 mmol/L   Calcium 9.4 8.7 - 10.2 mg/dL   Total Protein 6.7 6.0 - 8.5 g/dL   Albumin 4.7 4.1 - 5.1 g/dL   Globulin, Total 2.0 1.5 - 4.5 g/dL   Albumin/Globulin Ratio 2.4 (H) 1.2 - 2.2   Bilirubin Total 0.4 0.0 - 1.2 mg/dL   Alkaline Phosphatase 59 44 - 121 IU/L   AST 16 0 - 40 IU/L   ALT 20 0 - 44 IU/L  Lipid panel  Result Value Ref Range   Cholesterol, Total 194 100 - 199 mg/dL   Triglycerides 48 0 - 149 mg/dL   HDL 53 >56 mg/dL   VLDL Cholesterol Cal 9 5 - 40 mg/dL   LDL Chol Calc (NIH) 213 (H) 0 - 99 mg/dL   Chol/HDL Ratio 3.7 0.0 - 5.0 ratio  TSH + free T4  Result Value Ref Range   TSH 0.756 0.450 - 4.500 uIU/mL   Free T4 1.20 0.82 - 1.77 ng/dL    Assessment & Plan    Encounter for general adult medical examination with abnormal findings  Mixed hyperlipidemia Assessment & Plan: Check fasting lipid panel today.  -treat HLD as indicated   Orders: -     Lipid panel; Future  Other fatigue Assessment & Plan: Check labs for further evaluation.   Orders: -     TSH + free T4; Future -     Comprehensive metabolic panel; Future -     CBC; Future -     Hemoglobin A1c; Future  Impaired fasting glucose Assessment & Plan: Check labs, including HgbA1c for further evaluation.   Orders: -     Comprehensive metabolic panel; Future -     CBC; Future -     Hemoglobin A1c; Future  Chronic midline low back pain without sciatica Assessment & Plan: May continue to take meloxicam up to twice daily as needed for pain and  inflammation.  -reviewed exercises which can relieve back pain.  -consider new referral to orthopedics if needed        Immunization History  Administered Date(s) Administered   Td 07/01/2003   Tdap 12/31/2017    Health Maintenance  Topic Date Due   COVID-19 Vaccine (1) Never done   HIV Screening  Never done   Hepatitis C Screening  Never done   INFLUENZA VACCINE  04/03/2023   DTaP/Tdap/Td (3 - Td or Tdap) 01/01/2028   HPV VACCINES  Aged Out    Discussed health benefits of physical activity, and encouraged him to engage in regular exercise appropriate for his age and condition.      Return in about 1 year (around 11/22/2023) for health maintenance exam, FBW at time of visit.        Carlean Jews, NP  [facility] [phone] (phone) [phone] (fax)  [place]

## 2022-11-22 ENCOUNTER — Encounter: Payer: Self-pay | Admitting: Nurse Practitioner

## 2022-11-22 ENCOUNTER — Ambulatory Visit (INDEPENDENT_AMBULATORY_CARE_PROVIDER_SITE_OTHER): Payer: 59 | Admitting: Nurse Practitioner

## 2022-11-22 VITALS — BP 132/62 | HR 66 | Ht 72.0 in | Wt 236.0 lb

## 2022-11-22 DIAGNOSIS — R5383 Other fatigue: Secondary | ICD-10-CM

## 2022-11-22 DIAGNOSIS — M545 Low back pain, unspecified: Secondary | ICD-10-CM

## 2022-11-22 DIAGNOSIS — Z0001 Encounter for general adult medical examination with abnormal findings: Secondary | ICD-10-CM

## 2022-11-22 DIAGNOSIS — G8929 Other chronic pain: Secondary | ICD-10-CM

## 2022-11-22 DIAGNOSIS — R7301 Impaired fasting glucose: Secondary | ICD-10-CM

## 2022-11-22 DIAGNOSIS — E782 Mixed hyperlipidemia: Secondary | ICD-10-CM | POA: Diagnosis not present

## 2022-11-23 LAB — HEMOGLOBIN A1C
Est. average glucose Bld gHb Est-mCnc: 111 mg/dL
Hgb A1c MFr Bld: 5.5 % (ref 4.8–5.6)

## 2022-11-23 LAB — COMPREHENSIVE METABOLIC PANEL
ALT: 20 IU/L (ref 0–44)
AST: 16 IU/L (ref 0–40)
Albumin/Globulin Ratio: 2.4 — ABNORMAL HIGH (ref 1.2–2.2)
Albumin: 4.7 g/dL (ref 4.1–5.1)
Alkaline Phosphatase: 59 IU/L (ref 44–121)
BUN/Creatinine Ratio: 18 (ref 9–20)
BUN: 19 mg/dL (ref 6–20)
Bilirubin Total: 0.4 mg/dL (ref 0.0–1.2)
CO2: 21 mmol/L (ref 20–29)
Calcium: 9.4 mg/dL (ref 8.7–10.2)
Chloride: 104 mmol/L (ref 96–106)
Creatinine, Ser: 1.03 mg/dL (ref 0.76–1.27)
Globulin, Total: 2 g/dL (ref 1.5–4.5)
Glucose: 98 mg/dL (ref 70–99)
Potassium: 5.4 mmol/L — ABNORMAL HIGH (ref 3.5–5.2)
Sodium: 141 mmol/L (ref 134–144)
Total Protein: 6.7 g/dL (ref 6.0–8.5)
eGFR: 96 mL/min/{1.73_m2} (ref >59)

## 2022-11-23 LAB — CBC
Hematocrit: 44.4 % (ref 37.5–51.0)
Hemoglobin: 15.1 g/dL (ref 13.0–17.7)
MCH: 29.7 pg (ref 26.6–33.0)
MCHC: 34 g/dL (ref 31.5–35.7)
MCV: 87 fL (ref 79–97)
Platelets: 279 10*3/uL (ref 150–450)
RBC: 5.09 x10E6/uL (ref 4.14–5.80)
RDW: 12.6 % (ref 11.6–15.4)
WBC: 5.4 10*3/uL (ref 3.4–10.8)

## 2022-11-23 LAB — LIPID PANEL
Chol/HDL Ratio: 3.7 ratio (ref 0.0–5.0)
Cholesterol, Total: 194 mg/dL (ref 100–199)
HDL: 53 mg/dL (ref >39)
LDL Chol Calc (NIH): 132 mg/dL — ABNORMAL HIGH (ref 0–99)
Triglycerides: 48 mg/dL (ref 0–149)
VLDL Cholesterol Cal: 9 mg/dL (ref 5–40)

## 2022-11-23 LAB — TSH+FREE T4
Free T4: 1.2 ng/dL (ref 0.82–1.77)
TSH: 0.756 u[IU]/mL (ref 0.450–4.500)

## 2023-01-01 DIAGNOSIS — R7301 Impaired fasting glucose: Secondary | ICD-10-CM | POA: Insufficient documentation

## 2023-01-01 DIAGNOSIS — R5383 Other fatigue: Secondary | ICD-10-CM | POA: Insufficient documentation

## 2023-01-01 DIAGNOSIS — E782 Mixed hyperlipidemia: Secondary | ICD-10-CM | POA: Insufficient documentation

## 2023-01-01 NOTE — Assessment & Plan Note (Signed)
Check labs, including HgbA1c for further evaluation.

## 2023-01-01 NOTE — Assessment & Plan Note (Signed)
May continue to take meloxicam up to twice daily as needed for pain and inflammation.  -reviewed exercises which can relieve back pain.  -consider new referral to orthopedics if needed

## 2023-01-01 NOTE — Assessment & Plan Note (Signed)
Check fasting lipid panel today.  -treat HLD as indicated

## 2023-01-01 NOTE — Assessment & Plan Note (Signed)
Check labs for further evaluation.  

## 2023-03-03 NOTE — Progress Notes (Signed)
Mild elevation of LDL.  Potassium slightly elevated at 5.4 though not previously elevated.  Other labs normal.  Repeat in 1 year.

## 2023-11-28 ENCOUNTER — Encounter: Payer: Self-pay | Admitting: Family Medicine

## 2023-11-28 ENCOUNTER — Encounter: Payer: Managed Care, Other (non HMO) | Admitting: Nurse Practitioner

## 2023-11-28 ENCOUNTER — Ambulatory Visit (INDEPENDENT_AMBULATORY_CARE_PROVIDER_SITE_OTHER): Payer: Managed Care, Other (non HMO) | Admitting: Family Medicine

## 2023-11-28 VITALS — BP 126/74 | HR 63 | Ht 72.0 in | Wt 237.2 lb

## 2023-11-28 DIAGNOSIS — Z0000 Encounter for general adult medical examination without abnormal findings: Secondary | ICD-10-CM

## 2023-11-28 DIAGNOSIS — R7301 Impaired fasting glucose: Secondary | ICD-10-CM

## 2023-11-28 DIAGNOSIS — E782 Mixed hyperlipidemia: Secondary | ICD-10-CM

## 2023-11-28 DIAGNOSIS — Z6831 Body mass index (BMI) 31.0-31.9, adult: Secondary | ICD-10-CM | POA: Diagnosis not present

## 2023-11-28 NOTE — Patient Instructions (Signed)
 It was nice to see you today,  We addressed the following topics today: -We will get some blood test today and then I will let you know the results when I get them - To help with lowering your cholesterol levels I would recommend 150 minutes of moderate intensity exercise per week however you want to split that up, weight loss of about 15 to 20 pounds, and limiting your saturated fat intake daily to less than 10 g.  Saturated fat is generally found in red meats and dairy products as well as processed foods made with dairy products.  Have a great day,  Frederic Jericho, MD

## 2023-11-28 NOTE — Assessment & Plan Note (Signed)
 Rechecking A1c today

## 2023-11-28 NOTE — Assessment & Plan Note (Signed)
 Counseled on dietary lifestyle modifications.  Overall ASCVD risk is low.

## 2023-11-28 NOTE — Progress Notes (Signed)
   Annual physical  Subjective   Patient ID: Carlos Silva, male    DOB: 01-Apr-1986  Age: 38 y.o. MRN: [id number]  Chief Complaint  Patient presents with   Annual Exam   HPI Carlos Silva is a 38 y.o. old male here  for annual exam.  Patient has no questions or concerns today.  We discussed the patient's previous lab values from last year, specifically his cholesterol level and his potassium level.  Patient not taking any medications, supplements.  Patient has no specific diet or dietary restrictions.  Eats a typical American diet.  Work:manager at [employer].  Relationship:married.   Children:2 children.   Tobacco:no Alcohol:5/week.  Beer.  Recreational drugs:no  Diet: Regular Exercise:walking/lifting at work.  .    Family history of prostate or colorectal cancer:no  Family history of CVA and grandfather in his 58s.   The ASCVD Risk score (Arnett DK, et al., 2019) failed to calculate for the following reasons:   The 2019 ASCVD risk score is only valid for ages 38 to 27  Health Maintenance Due  Topic Date Due   COVID-19 Vaccine (1) Never done   HIV Screening  Never done   Hepatitis C Screening  Never done      Objective:     BP 126/74   Pulse 63   Ht 6' (1.829 m)   Wt 237 lb 4 oz (107.6 kg)   SpO2 99%   BMI 32.18 kg/m    Physical Exam General: Alert, oriented HEENT: PERRLA, EOMI, moist mucosa CV: Regular rhythm Pulmonary: Clear bilaterally GI: Soft, normal bowel sounds MSK: Strength equal bilaterally, normal gait Extremities: No pedal edema Psych: Pleasant affect.   No results found for any visits on 11/28/23.      Assessment & Plan:   Physical exam, annual  Impaired fasting glucose Assessment & Plan: Rechecking A1c today  Orders: -     Hemoglobin A1c  Body mass index (BMI) of 31.0-31.9 in adult -     Basic metabolic panel with GFR  Mixed hyperlipidemia Assessment & Plan: Counseled on dietary lifestyle modifications.  Overall ASCVD risk  is low.  Orders: -     Lipid panel     Return in about 1 year (around 11/27/2024) for physical.    Sandre Kitty, MD

## 2023-11-29 LAB — LIPID PANEL
Chol/HDL Ratio: 3.9 ratio (ref 0.0–5.0)
Cholesterol, Total: 193 mg/dL (ref 100–199)
HDL: 50 mg/dL (ref >39)
LDL Chol Calc (NIH): 132 mg/dL — ABNORMAL HIGH (ref 0–99)
Triglycerides: 60 mg/dL (ref 0–149)
VLDL Cholesterol Cal: 11 mg/dL (ref 5–40)

## 2023-11-29 LAB — BASIC METABOLIC PANEL WITH GFR
BUN/Creatinine Ratio: 16 (ref 9–20)
BUN: 17 mg/dL (ref 6–20)
CO2: 21 mmol/L (ref 20–29)
Calcium: 9.3 mg/dL (ref 8.7–10.2)
Chloride: 103 mmol/L (ref 96–106)
Creatinine, Ser: 1.09 mg/dL (ref 0.76–1.27)
Glucose: 100 mg/dL — ABNORMAL HIGH (ref 70–99)
Potassium: 5 mmol/L (ref 3.5–5.2)
Sodium: 139 mmol/L (ref 134–144)
eGFR: 89 mL/min/{1.73_m2} (ref >59)

## 2023-11-29 LAB — HEMOGLOBIN A1C
Est. average glucose Bld gHb Est-mCnc: 114 mg/dL
Hgb A1c MFr Bld: 5.6 % (ref 4.8–5.6)

## 2023-12-01 ENCOUNTER — Encounter: Payer: Self-pay | Admitting: Family Medicine

## 2024-11-26 ENCOUNTER — Other Ambulatory Visit

## 2024-11-30 ENCOUNTER — Encounter: Admitting: Family Medicine
